# Patient Record
Sex: Male | Born: 1990
Health system: Southern US, Community
[De-identification: ages and names within clinical notes are randomized; demographics above are authoritative.]

## PROBLEM LIST (undated history)

## (undated) DIAGNOSIS — S3609XA Other injury of spleen, initial encounter: Secondary | ICD-10-CM

## (undated) DIAGNOSIS — C801 Malignant (primary) neoplasm, unspecified: Secondary | ICD-10-CM

## (undated) HISTORY — DX: Other injury of spleen, initial encounter: S36.09XA

## (undated) HISTORY — PX: KNEE SURGERY: SHX244

## (undated) HISTORY — DX: Malignant (primary) neoplasm, unspecified: C80.1

---
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
## 2010-06-01 NOTE — H&P (Signed)
[name], [name] NO.:  [id number]   MEDICAL RECORD NO.:  [id number]          PATIENT TYPE:  EMS   LOCATION:  ED                           FACILITY:  [facility]   PHYSICIAN:  Thornton Park. Randall Deutscher, MD  DATE OF BIRTH:  05/23/1990   DATE OF ADMISSION:  10/16/2007  DATE OF DISCHARGE:                              HISTORY & PHYSICAL   CHIEF COMPLAINT:  Abdominal pain after falling from a horse with splenic  laceration.   HISTORY OF PRESENT ILLNESS:  Randall Ward is an 20 year old white male  who fell from a horse landing on his left side yesterday afternoon,  October 15, 2007 at about 4 p.m.  There was no loss of consciousness.  It hurt when landed, but he had some increasing abdominal pain as the  night wore on and his father brought him to the [facility] Emergency  Room where he was seen and evaluated by me following evaluation by the  ER doctors.  The ER evaluation included a CT scan of his abdomen and  pelvis which showed a grade 4 splenic LAC and a preexistent mildly  enlarged splenomegalic organ.  There was some blood in his pelvis.  There did not appear to active extravasation on the CT when evaluated by  me.   The patient is comfortable, alert, oriented and in no distress and has  been hemodynamically stable.  After evaluation by me and then discussion  with Dr.  Megan Mans, we will transfer the patient from [facility] to  [facility] and I will admit him to the peds floor for observation and  management of his splenic laceration.   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 Ramiro Harvest who he  sees on the season on annual basis.   FAMILY HISTORY:  Noncontributory.   SOCIAL HISTORY:  He is an 20 year old boy, otherwise nothing.   The patient has no known drug allergies.   He takes no medications.   REVIEW OF SYSTEMS:  He denies any right lower quadrant abdominal pain,  GI  problems referable to his appendix.   PHYSICAL EXAMINATION:  He is afebrile, pulse 80, respirations 16, blood  pressure 133/51.  GENERAL:  Well developed, mildly overweight white male in no acute  distress, but he has received some pain meds prior to my evaluation.  HEAD:  Normocephalic.  EYES:  Sclerae nonicteric.  Pupils equal, round and reactive to light.  NOSE AND THROAT EXAM:  Unremarkable.  NECK:  Supple.  CHEST:  Clear to auscultation.  HEART: Sinus rhythm without murmurs.  ABDOMEN:  Mildly obese, but no exquisite tenderness.  No rebound or  guarding.  GENITALIA:  Normal.  EXTREMITIES EXAM:  Full range of motion with no complaints of  discomfort.  NEURO:  Alert, oriented x3.  Motor and sensory function grossly intact.  SKIN:  No abrasions noted.  PSYCH:  The young man seems appropriate in affect and cognition in my  discussion of this injury and its management with him and  his father.   LABORATORY DATA:  Hemoglobin 15.6, white count 15,000.  CMET was a  normal, except for mildly elevated glucose, total bilirubin 1.4 with  indirect of 1.2 perhaps suggesting an underlying Gilbert phenomenon.  CT  scan shows splenomegaly with grade 4 laceration and rupture with some  hemoperitoneum as evidenced by blood down in his pelvis.  There is also  incidental finding of an appendicolith.   IMPRESSION:  A traumatic splenic rupture, hemodynamically stable.   PLAN:  Admit to [facility] Trauma Service for observation.  We will transfer  the care length from [facility] to [facility].      Thornton Park Randall Deutscher, MD  Electronically Signed     MBM/MEDQ  D:  10/16/2007  T:  10/16/2007  Job:  [id number]   cc:   Ramiro Harvest, Dr.  [facility] [place], [place]

## 2010-06-01 NOTE — Discharge Summary (Signed)
Randall Ward, POUND NO.:  [id number]   MEDICAL RECORD NO.:  [id number]          PATIENT TYPE:  INP   LOCATION:  5154                         FACILITY:  [facility]   PHYSICIAN:  Cherylynn Ridges, M.D.    DATE OF BIRTH:  21-Sep-1990   DATE OF ADMISSION:  10/16/2007  DATE OF DISCHARGE:  10/20/2007                               DISCHARGE SUMMARY   ADMITTING TRAUMA SURGEON:  Molli Hazard B. Daphine Deutscher, MD   DISCHARGE DIAGNOSES:  1. Fall from a horse.  2. Grade 4 spleen laceration.  3. Mild acute blood loss anemia.  4. Mild obesity.  5. Remote history of acute lymphocytic leukemia as an infant.   HISTORY ON ADMISSION:  This is a very pleasant 20 year old male who fell  from a horse on the afternoon of October 15, 2007, landing directly  onto his left side.  He had no loss of consciousness.  He developed  progressive complaints of abdominal pain and was taken to the emergency  room at [facility].  He had some generalized tenderness over the left  upper quadrant, but was otherwise without complaints.  CT scan of his  abdomen showed splenomegaly with a grade 4 rupture and hemoperitoneum.  The patient was transferred to [facility] Trauma for further care.   The patient was admitted and placed in step-down unit initially for  close observation.  His initial hemoglobin dropped to a low of 11.4, but  this may have been partially hemodilutional.  He initially had an ileus,  but this quickly resolved and he was started on clear liquids and his  diet was advanced to regular as tolerated.  He was mobilized initially  out of bed to chair and then allowed to ambulate, on hospital day #5.  However, he developed nausea and vomiting on the anticipated morning of  discharge and therefore it was recommended that he continued to be  observed overnight.  He continued to do well overnight without any  further nausea or vomiting.  His hemoglobin the following morning was  12.5, hematocrit was 36.5,  white blood cell count was 10,700, and  platelets were 286,000.  He was tolerating p.o.'s and having bowel  movements and passing flatus, and it was felt he could be discharged  home with the supervision of his parents.   MEDICATIONS AT THE TIME OF DISCHARGE:  1. Percocet 5/325 one to two p.o. q.4 h. p.r.n. pain, #60 no refill.  2. Multivitamin with iron one daily with a meal.  3. Colace 100 mg one capsule b.i.d.  4. MiraLax 17 g p.r.n. constipation.   He is cautioned against no vigorous activity.  He was also cautioned to  avoid medications with natural anti-inflammatory components or aspirin  until cleared by his physician.  He cannot return to school at this  time, and we will reevaluate him on October 25, 2007.  It is likely that  we will plan on rescanning him at some point to reevaluate for return to  more vigorous activity and this was all discussed with the patient's  mother.  Shawn Rayburn, P.A.      Cherylynn Ridges, M.D.  Electronically Signed    SR/MEDQ  D:  10/20/2007  T:  10/20/2007  Job:  [id number]   cc:   [facility]

## 2010-08-30 ENCOUNTER — Ambulatory Visit: Payer: Self-pay | Admitting: Family Medicine

## 2010-10-18 LAB — CBC
HCT: 34.1 — ABNORMAL LOW
HCT: 36.1 — ABNORMAL LOW
HCT: 36.2 — ABNORMAL LOW
HCT: 37.1 — ABNORMAL LOW
HCT: 45
Hemoglobin: 11.8 — ABNORMAL LOW
Hemoglobin: 12.2 — ABNORMAL LOW
Hemoglobin: 12.4 — ABNORMAL LOW
Hemoglobin: 13
Hemoglobin: 15.6
MCHC: 33.9
MCHC: 34.2
MCHC: 34.6
MCHC: 34.7
MCHC: 35.1
MCV: 90.1
MCV: 90.8
MCV: 91.6
MCV: 92.4
MCV: 92.6
Platelets: 179
Platelets: 195
Platelets: 211
Platelets: 219
Platelets: 234
RBC: 3.68 — ABNORMAL LOW
RBC: 3.91 — ABNORMAL LOW
RBC: 3.96 — ABNORMAL LOW
RBC: 4.12 — ABNORMAL LOW
RBC: 4.96
RDW: 12.5
RDW: 13.1
RDW: 13.4
RDW: 13.6
RDW: 13.9
WBC: 10.2
WBC: 10.3
WBC: 11.8 — ABNORMAL HIGH
WBC: 15 — ABNORMAL HIGH
WBC: 17.3 — ABNORMAL HIGH

## 2010-10-18 LAB — URINALYSIS, ROUTINE W REFLEX MICROSCOPIC
Glucose, UA: NEGATIVE
Hgb urine dipstick: NEGATIVE
Ketones, ur: NEGATIVE
Leukocytes, UA: NEGATIVE
Nitrite: NEGATIVE
Protein, ur: 30 — AB
Specific Gravity, Urine: >1.046 — ABNORMAL HIGH
Urobilinogen, UA: 1
pH: 6

## 2010-10-18 LAB — POCT I-STAT, CHEM 8
BUN: 11
Calcium, Ion: 1.14
Chloride: 105
Creatinine, Ser: 1.2
Glucose, Bld: 121 — ABNORMAL HIGH
HCT: 47
Hemoglobin: 16
Potassium: 3.5
Sodium: 142
TCO2: 30

## 2010-10-18 LAB — URINE MICROSCOPIC-ADD ON

## 2010-10-18 LAB — HEPATIC FUNCTION PANEL
ALT: 23
AST: 31
Albumin: 4.2
Alkaline Phosphatase: 91
Bilirubin, Direct: 0.2
Indirect Bilirubin: 1.2 — ABNORMAL HIGH
Total Bilirubin: 1.4 — ABNORMAL HIGH
Total Protein: 7

## 2010-10-18 LAB — DIFFERENTIAL
Basophils Absolute: 0
Basophils Relative: 0
Eosinophils Absolute: 0
Eosinophils Relative: 0
Lymphocytes Relative: 5 — ABNORMAL LOW
Lymphs Abs: 0.8
Monocytes Absolute: 1.1 — ABNORMAL HIGH
Monocytes Relative: 7
Neutro Abs: 13.1 — ABNORMAL HIGH
Neutrophils Relative %: 87 — ABNORMAL HIGH

## 2010-10-18 LAB — PROTIME-INR
INR: 1.2
Prothrombin Time: 15.3 — ABNORMAL HIGH

## 2010-10-18 LAB — ABO/RH: ABO/RH(D): A POS

## 2010-10-18 LAB — TYPE AND SCREEN
ABO/RH(D): A POS
Antibody Screen: NEGATIVE

## 2010-10-19 LAB — BASIC METABOLIC PANEL
BUN: 6
CO2: 29
Calcium: 8.9
Chloride: 100
Creatinine, Ser: 0.83
GFR calc Af Amer: >60
GFR calc non Af Amer: >60
Glucose, Bld: 102 — ABNORMAL HIGH
Potassium: 3.9
Sodium: 136

## 2010-10-19 LAB — CBC
HCT: 33.2 — ABNORMAL LOW
HCT: 35.3 — ABNORMAL LOW
HCT: 36.5 — ABNORMAL LOW
HCT: 37.9 — ABNORMAL LOW
Hemoglobin: 11.4 — ABNORMAL LOW
Hemoglobin: 12.3 — ABNORMAL LOW
Hemoglobin: 12.5 — ABNORMAL LOW
Hemoglobin: 12.9 — ABNORMAL LOW
MCHC: 34.1
MCHC: 34.1
MCHC: 34.3
MCHC: 34.7
MCV: 91.6
MCV: 92.3
MCV: 92.8
MCV: 93.4
Platelets: 203
Platelets: 235
Platelets: 263
Platelets: 286
RBC: 3.57 — ABNORMAL LOW
RBC: 3.85 — ABNORMAL LOW
RBC: 3.91 — ABNORMAL LOW
RBC: 4.11 — ABNORMAL LOW
RDW: 13.1
RDW: 13.2
RDW: 13.3
RDW: 13.3
WBC: 10.7 — ABNORMAL HIGH
WBC: 11 — ABNORMAL HIGH
WBC: 11.2 — ABNORMAL HIGH
WBC: 11.7 — ABNORMAL HIGH

## 2010-12-23 ENCOUNTER — Ambulatory Visit (INDEPENDENT_AMBULATORY_CARE_PROVIDER_SITE_OTHER): Payer: BC Managed Care – PPO | Admitting: Family Medicine

## 2010-12-23 ENCOUNTER — Encounter: Payer: Self-pay | Admitting: Family Medicine

## 2010-12-23 VITALS — BP 126/68 | HR 63 | Temp 98.5°F | Ht 70.0 in | Wt 170.2 lb

## 2010-12-23 DIAGNOSIS — Z23 Encounter for immunization: Secondary | ICD-10-CM

## 2010-12-23 DIAGNOSIS — Z859 Personal history of malignant neoplasm, unspecified: Secondary | ICD-10-CM

## 2010-12-23 NOTE — Patient Instructions (Signed)
Take care, keep exercising and let me know if you have other concerns.  Glad to see you today.

## 2010-12-24 ENCOUNTER — Encounter: Payer: Self-pay | Admitting: Family Medicine

## 2010-12-24 DIAGNOSIS — Z859 Personal history of malignant neoplasm, unspecified: Secondary | ICD-10-CM | POA: Insufficient documentation

## 2010-12-24 DIAGNOSIS — Z23 Encounter for immunization: Secondary | ICD-10-CM | POA: Insufficient documentation

## 2010-12-24 NOTE — Progress Notes (Signed)
New to est care.   Needs flu shot.  D/w pt.  H/o ALL at 18 months, in remission, followed by [facility].  No bruising, bleeding, 'B' sx.  Requesting records.    H/o knee surgery, requesting records.    PMH and SH reviewed  ROS: See HPI, otherwise noncontributory.  Meds, vitals, and allergies reviewed.   GEN: nad, alert and oriented HEENT: mucous membranes moist NECK: supple w/o LA CV: rrr PULM: ctab, no inc wob ABD: soft, +bs EXT: no edema SKIN: no acute rash No axillary LA

## 2010-12-24 NOTE — Assessment & Plan Note (Signed)
Requesting records.  Benign exam and recent history.

## 2010-12-24 NOTE — Assessment & Plan Note (Signed)
Done today, d/w pt.

## 2011-01-20 ENCOUNTER — Encounter: Payer: Self-pay | Admitting: Family Medicine

## 2011-01-24 ENCOUNTER — Encounter: Payer: Self-pay | Admitting: Family Medicine

## 2011-02-25 ENCOUNTER — Encounter: Payer: Self-pay | Admitting: Family Medicine

## 2011-02-25 ENCOUNTER — Ambulatory Visit (INDEPENDENT_AMBULATORY_CARE_PROVIDER_SITE_OTHER): Payer: BC Managed Care – PPO | Admitting: Family Medicine

## 2011-02-25 VITALS — BP 110/56 | HR 78 | Temp 98.6°F | Ht 70.0 in | Wt 160.0 lb

## 2011-02-25 DIAGNOSIS — Z0000 Encounter for general adult medical examination without abnormal findings: Secondary | ICD-10-CM

## 2011-02-25 DIAGNOSIS — Z029 Encounter for administrative examinations, unspecified: Secondary | ICD-10-CM

## 2011-02-25 LAB — POCT URINALYSIS DIPSTICK
Bilirubin, UA: NEGATIVE
Blood, UA: NEGATIVE
Glucose, UA: NEGATIVE
Ketones, UA: NEGATIVE
Leukocytes, UA: NEGATIVE
Nitrite, UA: NEGATIVE
Protein, UA: NEGATIVE
Spec Grav, UA: 1.01
Urobilinogen, UA: NEGATIVE
pH, UA: 6

## 2011-02-25 NOTE — Patient Instructions (Signed)
Glad to see you.  Let me know if you have other concerns.

## 2011-02-27 ENCOUNTER — Encounter: Payer: Self-pay | Admitting: Family Medicine

## 2011-02-27 DIAGNOSIS — Z029 Encounter for administrative examinations, unspecified: Secondary | ICD-10-CM | POA: Insufficient documentation

## 2011-02-27 NOTE — Progress Notes (Signed)
DOT physical.  See scanned forms. No complaints of any variety.   PMH and SH reviewed  Meds, vitals, and allergies reviewed.   ROS: See HPI.  Otherwise negative.    GEN: nad, alert and oriented HEENT: mucous membranes moist NECK: supple w/o LA CV: rrr. PULM: ctab, no inc wob ABD: soft, +bs EXT: no edema SKIN: no acute rash

## 2011-02-27 NOTE — Assessment & Plan Note (Addendum)
Okay for DOT application per history and exam.  See scanned forms. Should be okay for 2 year period. >25 min spent with face to face with patient, >50% counseling and/or coordinating care.

## 2011-11-17 ENCOUNTER — Telehealth: Payer: Self-pay

## 2011-11-17 NOTE — Telephone Encounter (Signed)
Pts father calls;Pt in [place] working today until late this evening; pt will be at home 11/18/11; last night started with severe S/T, drainage at back of throat, aches all over. Not sure if fever; pt did not tell father about head or chest congestion or cough. Main complaint was S/T and aching all over.[place].Should pt be seen or can med be called in for S/T.Please advise.

## 2011-11-17 NOTE — Telephone Encounter (Signed)
Patient's Dad advised.  Appt scheduled 11/18/11

## 2011-11-17 NOTE — Telephone Encounter (Signed)
Have him get back in town tomorrow.  If the symptoms continue, please add him to the schedule tomorrow.  He needs to be careful to avoid sedation cough medicine in meantime given his job.  Tylenol and salt water gargles are reasonable in meantime.  Thanks.

## 2011-11-18 ENCOUNTER — Ambulatory Visit: Payer: BC Managed Care – PPO | Admitting: Family Medicine

## 2011-11-24 ENCOUNTER — Ambulatory Visit (INDEPENDENT_AMBULATORY_CARE_PROVIDER_SITE_OTHER): Payer: BC Managed Care – PPO | Admitting: Family Medicine

## 2011-11-24 ENCOUNTER — Encounter: Payer: Self-pay | Admitting: Family Medicine

## 2011-11-24 VITALS — BP 122/60 | HR 86 | Temp 98.5°F | Wt 167.0 lb

## 2011-11-24 DIAGNOSIS — J069 Acute upper respiratory infection, unspecified: Secondary | ICD-10-CM

## 2011-11-24 MED ORDER — AMOXICILLIN 875 MG PO TABS: 875.0000 mg | ORAL_TABLET | Freq: Two times a day (BID) | ORAL | Status: AC

## 2011-11-24 NOTE — Assessment & Plan Note (Addendum)
Given the ST with exudates, I would treat.  Start amoxil and f/u prn.  Out of work in meantime.  Nontoxic.  He agrees.  See instructions.  He'll get a flu shot later.

## 2011-11-24 NOTE — Patient Instructions (Addendum)
Start the amoxil today.  Drink plenty of fluids, take ibuprofen as needed with food, and gargle with warm salt water for your throat.  This should gradually improve.  Take care.  Let us know if you have other concerns.

## 2011-11-24 NOTE — Progress Notes (Signed)
duration of symptoms: >1 week Started with ST and diffuse myalgias Took aleve and rested Travelled to a wedding, was improved.  Yesterday ST started again.  Now with more ear pain, coughing, bloody nose.   Possible fever last week.  No rash.  No sputum.   No facial pain.   The myalgias are some better today.   ST continues, has gotten worse.    ROS: See HPI.  Otherwise negative.    Meds, vitals, and allergies reviewed.   GEN: nad, alert and oriented HEENT: mucous membranes moist, TM w/o erythema, nasal epithelium injected- stuffy on R side with purulent discharge- bloody discharge on L side (not actively bleeding), OP with cobblestoning and tonsil with exudates NECK: supple w/o LA CV: rrr. PULM: ctab, no inc wob ABD: soft, +bs EXT: no edema

## 2012-01-27 LAB — PULMONARY FUNCTION TEST

## 2012-02-02 ENCOUNTER — Encounter: Payer: Self-pay | Admitting: Family Medicine

## 2012-02-02 ENCOUNTER — Ambulatory Visit (INDEPENDENT_AMBULATORY_CARE_PROVIDER_SITE_OTHER): Payer: BC Managed Care – PPO | Admitting: Family Medicine

## 2012-02-02 VITALS — BP 112/70 | HR 67 | Temp 98.3°F | Wt 177.0 lb

## 2012-02-02 DIAGNOSIS — R7989 Other specified abnormal findings of blood chemistry: Secondary | ICD-10-CM

## 2012-02-02 DIAGNOSIS — Z23 Encounter for immunization: Secondary | ICD-10-CM

## 2012-02-02 LAB — HEPATIC FUNCTION PANEL
ALT: 38 U/L (ref 0–53)
AST: 28 U/L (ref 0–37)
Albumin: 4.3 g/dL (ref 3.5–5.2)
Alkaline Phosphatase: 45 U/L (ref 39–117)
Bilirubin, Direct: 0 mg/dL (ref 0.0–0.3)
Total Bilirubin: 1.2 mg/dL (ref 0.3–1.2)
Total Protein: 7.3 g/dL (ref 6.0–8.3)

## 2012-02-02 NOTE — Patient Instructions (Addendum)
Go to the lab on the way out.  We'll contact you with your lab report.    Get the booster shot today, recheck vaccine lab in 1 month.  Take care.

## 2012-02-03 DIAGNOSIS — R7989 Other specified abnormal findings of blood chemistry: Secondary | ICD-10-CM | POA: Insufficient documentation

## 2012-02-03 NOTE — Progress Notes (Signed)
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  Also with low HBV titer.   No h/o jaundice other than after birth, resolved.  No abd pain, fever, vomiting, diarrhea, abd surgery.  Rare etoh.    Labs reviewed.   Meds, vitals, and allergies reviewed.   ROS: See HPI.  Otherwise, noncontributory.  GEN: nad, alert and oriented HEENT: mucous membranes moist NECK: supple w/o LA CV: rrr.  no murmur PULM: ctab, no inc wob ABD: soft, +bs EXT: no edema SKIN: no acute rash

## 2012-02-03 NOTE — Assessment & Plan Note (Signed)
Normal exam, resolve on recheck.  Likely false positive.  No f/u needed for this.  HBV booster done today, recheck titer in about 1 month.  He agrees.

## 2012-09-20 ENCOUNTER — Ambulatory Visit (INDEPENDENT_AMBULATORY_CARE_PROVIDER_SITE_OTHER): Payer: BC Managed Care – PPO | Admitting: Family Medicine

## 2012-09-20 ENCOUNTER — Encounter: Payer: Self-pay | Admitting: Family Medicine

## 2012-09-20 VITALS — BP 110/76 | HR 63 | Temp 97.9°F | Wt 183.5 lb

## 2012-09-20 DIAGNOSIS — L989 Disorder of the skin and subcutaneous tissue, unspecified: Secondary | ICD-10-CM

## 2012-09-20 NOTE — Progress Notes (Signed)
Spot on R arm.  Noted 2 days ago.  Feels tough, under the skin.  Not painful, not itchy.  Single spot noted.   Also with 2 nearby insect (not tick) bites.   Feels well o/w. No FCNAVD, weight loss,sweats.   Meds, vitals, and allergies reviewed.   ROS: See HPI.  Otherwise, noncontributory.  nad ncat Mmm rrr ctab abd soft No LA in neck, along clavicles, axillas.   2 small resolving pink lesions, <<1cm, appear consistent with insect bite.  <1cm lesion in the skin medial to R elbow, proximally.  Feels like a small nontender LN

## 2012-09-20 NOTE — Patient Instructions (Addendum)
Keep an eye on the spot.  Update me in about 7-10 days.   Notify me sooner if red, tender, enlarging, or if you feel worse.  I think this is either a lymph node or a benign (not cancer) tumor (like a lipoma).

## 2012-09-21 DIAGNOSIS — L989 Disorder of the skin and subcutaneous tissue, unspecified: Secondary | ICD-10-CM | POA: Insufficient documentation

## 2012-09-21 NOTE — Assessment & Plan Note (Signed)
LN vs cyst vs lipoma.  Would observe for now can call back prn. He agrees. No other sx suggestive of ominous process. This could have been related to the 2 likely insect bites. Pt agrees with plan.

## 2012-12-05 ENCOUNTER — Encounter: Payer: Self-pay | Admitting: Family Medicine

## 2012-12-05 ENCOUNTER — Ambulatory Visit (INDEPENDENT_AMBULATORY_CARE_PROVIDER_SITE_OTHER): Payer: BC Managed Care – PPO | Admitting: Family Medicine

## 2012-12-05 VITALS — BP 118/78 | HR 88 | Temp 99.2°F | Ht 70.0 in | Wt 167.8 lb

## 2012-12-05 DIAGNOSIS — J029 Acute pharyngitis, unspecified: Secondary | ICD-10-CM

## 2012-12-05 DIAGNOSIS — J069 Acute upper respiratory infection, unspecified: Secondary | ICD-10-CM

## 2012-12-05 NOTE — Patient Instructions (Signed)
Drink plenty of fluids, take tylenol as needed, and gargle with warm salt water for your throat.  This should gradually improve.  Take care.  Let us know if you have other concerns.    

## 2012-12-05 NOTE — Progress Notes (Signed)
Pre-visit discussion using our clinic review tool. No additional management support is needed unless otherwise documented below in the visit note.  Started yesterday with ST.  Chills.  Coughing and sneezing.  Yellow rhinorrhea.  Less ST today.  Works outside. Feels weak.  Dull aches, diffusely.  No rash. No vomiting, no diarrhea.    Meds, vitals, and allergies reviewed.   ROS: See HPI.  Otherwise, noncontributory.  GEN: nad, alert and oriented HEENT: mucous membranes moist, tm w/o erythema, nasal exam w/o erythema, clear discharge noted,  OP with cobblestoning, sinuses not ttp NECK: supple w/o LA CV: rrr.   PULM: ctab, no inc wob EXT: no edema SKIN: no acute rash

## 2012-12-06 NOTE — Assessment & Plan Note (Signed)
Likely viral, nontoxic, supportive care. RST neg. D/w pt.

## 2013-06-07 ENCOUNTER — Ambulatory Visit (INDEPENDENT_AMBULATORY_CARE_PROVIDER_SITE_OTHER): Payer: BC Managed Care – PPO | Admitting: Family Medicine

## 2013-06-07 ENCOUNTER — Encounter: Payer: Self-pay | Admitting: Family Medicine

## 2013-06-07 VITALS — BP 122/80 | HR 70 | Temp 98.3°F | Wt 177.8 lb

## 2013-06-07 DIAGNOSIS — J029 Acute pharyngitis, unspecified: Secondary | ICD-10-CM

## 2013-06-07 DIAGNOSIS — J069 Acute upper respiratory infection, unspecified: Secondary | ICD-10-CM

## 2013-06-07 LAB — POCT RAPID STREP A (OFFICE): Rapid Strep A Screen: NEGATIVE

## 2013-06-07 MED ORDER — LIDOCAINE VISCOUS 2 % MT SOLN: 10.0000 mL | OROMUCOSAL | Status: DC | PRN

## 2013-06-07 NOTE — Assessment & Plan Note (Signed)
Likely viral, nontoxic, supportive care.  See instructions.  F/u prn.

## 2013-06-07 NOTE — Progress Notes (Signed)
Pre visit review using our clinic review tool, if applicable. No additional management support is needed unless otherwise documented below in the visit note.  duration of symptoms: a few days Post nasal gtt: yes Rhinorrhea: no congestion:no ear pain:no sore throat:yes cough:yes Myalgias: yes, possibly from working out vs illness Fevers: no No vomiting, no diarrhea.   ROS: See HPI.  Otherwise negative.    Meds, vitals, and allergies reviewed.   GEN: nad, alert and oriented HEENT: mucous membranes moist, TM w/o erythema, nasal epithelium injected, OP with cobblestoning NECK: supple w/o LA CV: rrr. PULM: ctab, no inc wob ABD: soft, +bs EXT: no edema  RST neg.

## 2013-06-07 NOTE — Patient Instructions (Signed)
Drink plenty of fluids, take tylenol as needed, and gargle with warm salt water for your throat.  This should gradually improve.  Take care.  Let us know if you have other concerns.   Use lidocaine for your throat if needed.

## 2013-10-03 ENCOUNTER — Telehealth: Payer: Self-pay | Admitting: *Deleted

## 2013-10-03 DIAGNOSIS — R7989 Other specified abnormal findings of blood chemistry: Secondary | ICD-10-CM

## 2013-10-03 NOTE — Telephone Encounter (Signed)
See notes received from [facility] Dr. Johney Frame. Left message for patient to call back to discuss per Dr. Josefine Class notes.

## 2013-10-09 ENCOUNTER — Other Ambulatory Visit (INDEPENDENT_AMBULATORY_CARE_PROVIDER_SITE_OTHER): Payer: BC Managed Care – PPO

## 2013-10-09 DIAGNOSIS — R799 Abnormal finding of blood chemistry, unspecified: Secondary | ICD-10-CM

## 2013-10-09 DIAGNOSIS — R7989 Other specified abnormal findings of blood chemistry: Secondary | ICD-10-CM

## 2013-10-09 NOTE — Telephone Encounter (Signed)
Ordered. Thanks

## 2013-10-09 NOTE — Telephone Encounter (Signed)
Spoke to patient and was advised that he has seen the renal doctor and has had his Creatinine checked twice. Patient requested to have it checked this time by Dr. Damita Dunnings. Patient is on the schedule this afternoon to have lab work done. Please put order in.

## 2013-10-10 LAB — BASIC METABOLIC PANEL
BUN: 15 mg/dL (ref 6–23)
CO2: 30 mEq/L (ref 19–32)
CREATININE: 1.2 mg/dL (ref 0.4–1.5)
Calcium: 9.2 mg/dL (ref 8.4–10.5)
Chloride: 105 mEq/L (ref 96–112)
GFR: 82.11 mL/min (ref >60.00)
Glucose, Bld: 71 mg/dL (ref 70–99)
POTASSIUM: 4.2 meq/L (ref 3.5–5.1)
Sodium: 141 mEq/L (ref 135–145)

## 2013-10-11 ENCOUNTER — Telehealth: Payer: Self-pay | Admitting: Family Medicine

## 2013-10-11 NOTE — Telephone Encounter (Signed)
Opened in error

## 2014-05-28 ENCOUNTER — Ambulatory Visit (INDEPENDENT_AMBULATORY_CARE_PROVIDER_SITE_OTHER): Payer: BLUE CROSS/BLUE SHIELD | Admitting: Primary Care

## 2014-05-28 ENCOUNTER — Encounter: Payer: Self-pay | Admitting: Primary Care

## 2014-05-28 VITALS — BP 122/76 | HR 90 | Temp 99.5°F | Ht 70.0 in | Wt 199.8 lb

## 2014-05-28 DIAGNOSIS — J111 Influenza due to unidentified influenza virus with other respiratory manifestations: Secondary | ICD-10-CM | POA: Diagnosis not present

## 2014-05-28 LAB — POCT INFLUENZA A/B
INFLUENZA A, POC: POSITIVE
INFLUENZA B, POC: POSITIVE

## 2014-05-28 MED ORDER — OSELTAMIVIR PHOSPHATE 75 MG PO CAPS: 75.0000 mg | ORAL_CAPSULE | Freq: Two times a day (BID) | ORAL | Status: DC

## 2014-05-28 NOTE — Progress Notes (Signed)
Pre visit review using our clinic review tool, if applicable. No additional management support is needed unless otherwise documented below in the visit note. 

## 2014-05-28 NOTE — Progress Notes (Signed)
Subjective:    Patient ID: Randall Ward, male    DOB: Feb 11, 1990, 24 y.o.   MRN: [id number]  HPI  Randall Ward is a 24 year old male who presents today with a chief complaint of sudden onset of body aches and headache that started yesterday morning. He also reports cough with some congestion, chills, and fevers. He did not receive the flu vaccine last season. He's tried taking tylenol and alka seltzer cold without relief. Denies chest pain, abdominal pain, nausea, vomiting.   Review of Systems  Constitutional: Positive for fever, chills and fatigue.  HENT: Positive for congestion, rhinorrhea and sinus pressure. Negative for ear pain, sneezing and sore throat.   Eyes: Negative for itching.  Respiratory: Positive for cough. Negative for shortness of breath.   Cardiovascular: Negative for chest pain.  Gastrointestinal: Negative for nausea and vomiting.  Neurological: Positive for headaches.       Past Medical History  Diagnosis Date  . Cancer     ALL at 18 months, per [facility]  . Ruptured spleen     no surgery    History   Social History  . Marital Status: Single    Spouse Name: N/A  . Number of Children: N/A  . Years of Education: N/A   Occupational History  . Not on file.   Social History Main Topics  . Smoking status: Never Smoker   . Smokeless tobacco: Never Used  . Alcohol Use: No  . Drug Use: No  . Sexual Activity: Not on file   Other Topics Concern  . Not on file   Social History Narrative   [employer] grad   Working at [employer]    Past Surgical History  Procedure Laterality Date  . Knee surgery      left, 2012    Family History  Problem Relation Age of Onset  . Diabetes Father   . Arthritis Father   . Heart disease Father   . Hypertension Father   . Prostate cancer Neg Hx   . Colon cancer Neg Hx     No Known Allergies  Current Outpatient Prescriptions on File Prior to Visit  Medication Sig Dispense Refill  . ibuprofen (ADVIL,MOTRIN)  200 MG tablet Take 200 mg by mouth every 6 (six) hours as needed.     No current facility-administered medications on file prior to visit.    BP 122/76 mmHg  Pulse 90  Temp(Src) 99.5 F (37.5 C) (Oral)  Ht 5\' 10"  (1.778 m)  Wt 199 lb 12.8 oz (90.629 kg)  BMI 28.67 kg/m2  SpO2 97%    Objective:   Physical Exam  Constitutional: He is oriented to person, place, and time. He appears ill.  HENT:  Right Ear: Tympanic membrane and ear canal normal.  Left Ear: Tympanic membrane and ear canal normal.  Mouth/Throat: Posterior oropharyngeal erythema present.  Eyes: Conjunctivae are normal. Pupils are equal, round, and reactive to light.  Neck: Neck supple.  Cardiovascular: Normal rate and regular rhythm.   Pulmonary/Chest: Effort normal and breath sounds normal.  Lymphadenopathy:    He has no cervical adenopathy.  Neurological: He is alert and oriented to person, place, and time.  Skin: Skin is warm and dry.          Assessment & Plan:  Rapid Influenza:  Positive. Treatment with Tamiflu BID for 5 days. Declines cough medication. Push fluids, rest. Work note provided. Education on proper hand hygiene and to wear mask if  out in public.

## 2014-05-28 NOTE — Patient Instructions (Signed)
Your test was positive for influenza. Start Tamiflu. Take 1 capsule by mouth twice daily for 5 days. Push fluids (water), and get some rest. You may take Robitussin OTC for cough as needed.  Influenza Influenza ("the flu") is a viral infection of the respiratory tract. It occurs more often in winter months because people spend more time in close contact with one another. Influenza can make you feel very sick. Influenza easily spreads from person to person (contagious). CAUSES  Influenza is caused by a virus that infects the respiratory tract. You can catch the virus by breathing in droplets from an infected person's cough or sneeze. You can also catch the virus by touching something that was recently contaminated with the virus and then touching your mouth, nose, or eyes. RISKS AND COMPLICATIONS You may be at risk for a more severe case of influenza if you smoke cigarettes, have diabetes, have chronic heart disease (such as heart failure) or lung disease (such as asthma), or if you have a weakened immune system. Elderly people and pregnant women are also at risk for more serious infections. The most common problem of influenza is a lung infection (pneumonia). Sometimes, this problem can require emergency medical care and may be life threatening. SIGNS AND SYMPTOMS  Symptoms typically last 4 to 10 days and may include:  Fever.  Chills.  Headache, body aches, and muscle aches.  Sore throat.  Chest discomfort and cough.  Poor appetite.  Weakness or feeling tired.  Dizziness.  Nausea or vomiting. DIAGNOSIS  Diagnosis of influenza is often made based on your history and a physical exam. A nose or throat swab test can be done to confirm the diagnosis. TREATMENT  In mild cases, influenza goes away on its own. Treatment is directed at relieving symptoms. For more severe cases, your health care provider may prescribe antiviral medicines to shorten the sickness. Antibiotic medicines are not  effective because the infection is caused by a virus, not by bacteria. HOME CARE INSTRUCTIONS  Take medicines only as directed by your health care provider.  Use a cool mist humidifier to make breathing easier.  Get plenty of rest until your temperature returns to normal. This usually takes 3 to 4 days.  Drink enough fluid to keep your urine clear or pale yellow.  Cover yourmouth and nosewhen coughing or sneezing,and wash your handswellto prevent thevirusfrom spreading.  Stay homefromwork orschool untilthe fever is gonefor at least 67full day. PREVENTION  An annual influenza vaccination (flu shot) is the best way to avoid getting influenza. An annual flu shot is now routinely recommended for all adults in the [place] IF:  You experiencechest pain, yourcough worsens,or you producemore mucus.  Youhave nausea,vomiting, ordiarrhea.  Your fever returns or gets worse. SEEK IMMEDIATE MEDICAL CARE IF:  You havetrouble breathing, you become short of breath,or your skin ornails becomebluish.  You have severe painor stiffnessin the neck.  You develop a sudden headache, or pain in the face or ear.  You have nausea or vomiting that you cannot control. MAKE SURE YOU:   Understand these instructions.  Will watch your condition.  Will get help right away if you are not doing well or get worse. Document Released: 01/01/2000 Document Revised: 05/20/2013 Document Reviewed: 04/04/2011 [facility] Patient Information 2015 [place], [place]. This information is not intended to replace advice given to you by your health care provider. Make sure you discuss any questions you have with your health care provider.

## 2014-05-29 NOTE — Addendum Note (Signed)
Addended by: Jacqualin Combes on: 05/29/2014 08:55 AM   Modules accepted: Orders

## 2014-07-29 ENCOUNTER — Encounter: Payer: Self-pay | Admitting: Family Medicine

## 2014-07-29 ENCOUNTER — Ambulatory Visit (INDEPENDENT_AMBULATORY_CARE_PROVIDER_SITE_OTHER): Payer: BLUE CROSS/BLUE SHIELD | Admitting: Family Medicine

## 2014-07-29 VITALS — BP 130/80 | HR 60 | Temp 98.5°F | Wt 199.5 lb

## 2014-07-29 DIAGNOSIS — M79602 Pain in left arm: Secondary | ICD-10-CM

## 2014-07-29 MED ORDER — CYCLOBENZAPRINE HCL 10 MG PO TABS: 5.0000 mg | ORAL_TABLET | Freq: Three times a day (TID) | ORAL | Status: DC | PRN

## 2014-07-29 MED ORDER — PREDNISONE 20 MG PO TABS: ORAL_TABLET | ORAL | Status: DC

## 2014-07-29 NOTE — Patient Instructions (Signed)
Prednisone with food.  Sedation caution on flexeril.  Out of work for now.  Use heat for now.

## 2014-07-29 NOTE — Progress Notes (Signed)
Pre visit review using our clinic review tool, if applicable. No additional management support is needed unless otherwise documented below in the visit note.  L neck and arm pain.  Started last night.  Was reaching to plug in a phone last night when it started.  Pain radiates down to the L elbow.  No R sided sx.  No leg sx.  No FCNAVD.  He has ROM but pain with ROM.  Pain with neck ROM. No other trauma, no cause noted.   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    Meds, vitals, and allergies reviewed.   ROS: See HPI.  Otherwise, noncontributory.  nad ncat Neck supple but pain with ROM L arm with normal sensation and ROM but L trap ttp rrr ctab

## 2014-07-31 DIAGNOSIS — M79602 Pain in left arm: Secondary | ICD-10-CM | POA: Insufficient documentation

## 2014-07-31 NOTE — Assessment & Plan Note (Signed)
Likely radicular source with muscle spasm.  Start pred with routine cautions, sedation caution on flexeril, f/u prn.  He agrees.  No weakness, no need to image.

## 2016-01-04 ENCOUNTER — Ambulatory Visit (INDEPENDENT_AMBULATORY_CARE_PROVIDER_SITE_OTHER): Payer: Managed Care, Other (non HMO) | Admitting: Family Medicine

## 2016-01-04 ENCOUNTER — Encounter: Payer: Self-pay | Admitting: Family Medicine

## 2016-01-04 VITALS — BP 104/58 | HR 68 | Temp 98.6°F | Wt 211.5 lb

## 2016-01-04 DIAGNOSIS — M545 Low back pain, unspecified: Secondary | ICD-10-CM | POA: Insufficient documentation

## 2016-01-04 DIAGNOSIS — R05 Cough: Secondary | ICD-10-CM

## 2016-01-04 DIAGNOSIS — Z23 Encounter for immunization: Secondary | ICD-10-CM | POA: Diagnosis not present

## 2016-01-04 DIAGNOSIS — R350 Frequency of micturition: Secondary | ICD-10-CM | POA: Insufficient documentation

## 2016-01-04 DIAGNOSIS — M549 Dorsalgia, unspecified: Secondary | ICD-10-CM | POA: Diagnosis not present

## 2016-01-04 DIAGNOSIS — R059 Cough, unspecified: Secondary | ICD-10-CM | POA: Insufficient documentation

## 2016-01-04 LAB — POC URINALSYSI DIPSTICK (AUTOMATED)
Bilirubin, UA: NEGATIVE
Blood, UA: NEGATIVE
GLUCOSE UA: NEGATIVE
Ketones, UA: NEGATIVE
Leukocytes, UA: NEGATIVE
Nitrite, UA: NEGATIVE
PROTEIN UA: NEGATIVE
Spec Grav, UA: 1.025
UROBILINOGEN UA: 0.2
pH, UA: 6.5

## 2016-01-04 MED ORDER — FLUTICASONE PROPIONATE 50 MCG/ACT NA SUSP: 2.0000 | Freq: Every day | NASAL | Status: DC

## 2016-01-04 MED ORDER — LORATADINE 10 MG PO TABS: 10.0000 mg | ORAL_TABLET | Freq: Every day | ORAL | Status: DC

## 2016-01-04 NOTE — Assessment & Plan Note (Signed)
Likely from caffeine.  U/a wnl.  See notes on labs.

## 2016-01-04 NOTE — Patient Instructions (Signed)
Randall Ward will call about your referral. Take care.  Glad to see you.  Add on flonase if claritin doesn't help.  Update me as needed.

## 2016-01-04 NOTE — Assessment & Plan Note (Signed)
Likely worse with smoke exposure (dec in cough when coworker changed to vaping) and possible nasal irritation contribution with post nasal gtt.  Lungs clear.  Add on claritin and then flonase if needed.  We didn't need to image today.  Okay for outpatient f/u. He'll update me if not better.

## 2016-01-04 NOTE — Assessment & Plan Note (Signed)
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  Reasonable to see chiropractor.  D/w pt.  Refer.

## 2016-01-04 NOTE — Progress Notes (Signed)
Cough.  Going on for a few months.  Feels like throat congestion, "like a tickle in my throat."  Slightly less worse recently.  Intermittent.  Rare sputum.  No FCNAVD.  Codriver at work vapes.    Back pain.  Asking about going to chiropractor.  Driving to [place], makes about 8 stops there, then drives back.  He usually makes 2 runs a week.  Lower midline back, popping and cracking.  No leg radiation.  No weakness.  Some tingling in the knees.  He doesn't sit on his wallet.  No trauma.  No B/B sx.    Noted some urinary frequency.  U/a pending.  D/w pt about caffeine intake with work, likely contributing.  No dysuria o/w.    Meds, vitals, and allergies reviewed.   ROS: Per HPI unless specifically indicated in ROS section   nad ncat Tm wnl Nasal irritation noted.  OP wnl MMM Neck supple, no LA rrr ctab Back not ttp in midline Skin w/o rash BLE w/o edema S/S wnl BLE

## 2016-01-04 NOTE — Progress Notes (Signed)
Pre visit review using our clinic review tool, if applicable. No additional management support is needed unless otherwise documented below in the visit note. 

## 2016-04-14 ENCOUNTER — Ambulatory Visit (INDEPENDENT_AMBULATORY_CARE_PROVIDER_SITE_OTHER): Payer: No Typology Code available for payment source | Admitting: Family Medicine

## 2016-04-14 ENCOUNTER — Encounter: Payer: Self-pay | Admitting: Family Medicine

## 2016-04-14 DIAGNOSIS — M542 Cervicalgia: Secondary | ICD-10-CM

## 2016-04-14 DIAGNOSIS — Z720 Tobacco use: Secondary | ICD-10-CM | POA: Diagnosis not present

## 2016-04-14 NOTE — Progress Notes (Signed)
D/w pt about cessation of tobacco.  D/w pt about having a replacement for oral tobacco.    Neck pain.  Worse in the last week.  Upper posterior neck pain.  Bilateral pain, posterior, can radiate up his scalp/head, not midline.  He has a new pillow may not explain all of the sx.    Back pain.  He didn't get to see chiropractor about his lower back prev.  No radicular pain.  No B/B sx.  No weakness.    No FCNAVD.  Not fatigued.    Working 1 day on, 2 days off with Research officer, trade union.    He has more pain with prolonged sitting, ie either driving or in meetings.    Meds, vitals, and allergies reviewed.   ROS: Per HPI unless specifically indicated in ROS section   nad ncat Neck supple no midline pain but B paraspinal muscles ttp w/o bruising or rash No LA rrr Ctab abd soft Ext w/o edema.  No midline back pain.

## 2016-04-14 NOTE — Patient Instructions (Signed)
Try 2-3 ibuprofen up to 2-3 times a day with food.   See about getting a number for a chiropractor and I can put in the referral if needed.  Gently stretch your neck.  Update me as needed.   Take care.  Glad to see you.

## 2016-04-14 NOTE — Progress Notes (Signed)
Pre visit review using our clinic review tool, if applicable. No additional management support is needed unless otherwise documented below in the visit note. 

## 2016-04-17 DIAGNOSIS — Z720 Tobacco use: Secondary | ICD-10-CM | POA: Insufficient documentation

## 2016-04-17 DIAGNOSIS — M542 Cervicalgia: Secondary | ICD-10-CM | POA: Insufficient documentation

## 2016-04-17 NOTE — Assessment & Plan Note (Signed)
Encouraged cessation, d/w pt about strategies.

## 2016-04-17 NOTE — Assessment & Plan Note (Signed)
Likely MSK source with muscle spasms, likely with occipital neuralgia contributing, d/w pt.  Okay to see chiropractor, no need for imaging at this point, d/w pt about stretching.  He agrees.    He will also check with seeing chiropractor about his back.    Okay for outpatient f/u.

## 2017-11-22 ENCOUNTER — Encounter: Payer: Self-pay | Admitting: Family Medicine

## 2017-11-22 ENCOUNTER — Ambulatory Visit: Payer: 59 | Admitting: Family Medicine

## 2017-11-22 ENCOUNTER — Ambulatory Visit (INDEPENDENT_AMBULATORY_CARE_PROVIDER_SITE_OTHER)
Admission: RE | Admit: 2017-11-22 | Discharge: 2017-11-22 | Disposition: A | Discharge status: Home / Self Care | Payer: 59 | Source: Ambulatory Visit | Attending: Family Medicine | Admitting: Family Medicine

## 2017-11-22 VITALS — BP 102/66 | HR 72 | Temp 98.4°F | Ht 70.0 in | Wt 211.0 lb

## 2017-11-22 DIAGNOSIS — M2550 Pain in unspecified joint: Secondary | ICD-10-CM | POA: Diagnosis not present

## 2017-11-22 DIAGNOSIS — M542 Cervicalgia: Secondary | ICD-10-CM | POA: Diagnosis not present

## 2017-11-22 DIAGNOSIS — Z23 Encounter for immunization: Secondary | ICD-10-CM

## 2017-11-22 DIAGNOSIS — M79641 Pain in right hand: Secondary | ICD-10-CM | POA: Diagnosis not present

## 2017-11-22 NOTE — Patient Instructions (Signed)
Go to the lab on the way out.  We'll contact you with your xray and lab report. Take care.  Glad to see you.  We may need to get you set up with an arthritis clinic.   In the meantime, use a tennis elbow strap for your elbow.  See if that helps.

## 2017-11-22 NOTE — Progress Notes (Signed)
He had h/o neck pain.  Still with some pain with popping and cracking on ROM and some occipital headaches noted.    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  He has had B lateral elbow pain and B hand pain.  Most of the hand pain is near the metacarpals bilaterally.  He doesn't have typical neuropathic sx.  His hand/fingers don't get puffy or red.   H/o leukemia treatment at young age.    Meds, vitals, and allergies reviewed.   ROS: Per HPI unless specifically indicated in ROS section   GEN: nad, alert and oriented HEENT: mucous membranes moist NECK: supple w/o LA, normal range of motion CV: rrr. PULM: ctab, no inc wob ABD: soft, +bs EXT: no edema SKIN: no acute rash Normal grip bilaterally.  Normal sensation.  No tendon deficit on testing the fingers.  He does not have any active synovitis across the IP joints or the MCPs.  Wrists are normal on inspection.  Tinel negative bilaterally at the wrist. Normal radial pulse, normal cap refill. Normal range of motion of the shoulders and elbows.  He does not have tenderness on testing for tennis elbow.  Olecranon nontender.  No rash.

## 2017-11-23 DIAGNOSIS — M2550 Pain in unspecified joint: Secondary | ICD-10-CM | POA: Insufficient documentation

## 2017-11-23 NOTE — Assessment & Plan Note (Addendum)
He has a history of leukemia that was treated at a young age.  Unclear if that in any way affects his current situation.  He does have atypical amounts of aches given his age.  We talked about options to work this up.  Reasonable to check plain films of his hand and neck today.  Check basic labs in the meantime.  He does not have active synovitis on exam today.  No atypical findings at this point.  It is noted that today is a "good day" and he has not been as active recently.  I want him to try using a tennis elbow strap to see if that helps his elbow pain.  He may not have tennis elbow but it would be worth trying to see if this makes a difference, when he has symptoms.

## 2017-11-25 LAB — ANTI-NUCLEAR AB-TITER (ANA TITER)

## 2017-11-25 LAB — ANA,IFA RA DIAG PNL W/RFLX TIT/PATN
ANA: POSITIVE — AB
Cyclic Citrullin Peptide Ab: <16 UNITS
Rhuematoid fact SerPl-aCnc: <14 IU/mL (ref <14)

## 2017-11-26 ENCOUNTER — Other Ambulatory Visit: Payer: Self-pay | Admitting: Family Medicine

## 2017-11-26 DIAGNOSIS — M2550 Pain in unspecified joint: Secondary | ICD-10-CM

## 2017-11-28 ENCOUNTER — Telehealth: Payer: Self-pay | Admitting: Family Medicine

## 2017-11-28 NOTE — Telephone Encounter (Signed)
Pt returned Lugene's call in regards to his X-Ray.

## 2017-11-28 NOTE — Telephone Encounter (Signed)
Patient advised.

## 2017-12-05 ENCOUNTER — Telehealth: Payer: Self-pay | Admitting: Family Medicine

## 2017-12-05 NOTE — Telephone Encounter (Signed)
Pt returned call about referrak.

## 2017-12-05 NOTE — Telephone Encounter (Signed)
Spoke with pt  He wanted [facility] He is aware  rheumatology will contact him to schedule appointment notes faxed

## 2017-12-05 NOTE — Telephone Encounter (Signed)
Returned pt call  

## 2018-04-10 ENCOUNTER — Ambulatory Visit (INDEPENDENT_AMBULATORY_CARE_PROVIDER_SITE_OTHER)
Admission: RE | Admit: 2018-04-10 | Discharge: 2018-04-10 | Disposition: A | Discharge status: Home / Self Care | Payer: 59 | Source: Ambulatory Visit | Attending: Family Medicine | Admitting: Family Medicine

## 2018-04-10 ENCOUNTER — Encounter: Payer: Self-pay | Admitting: Family Medicine

## 2018-04-10 ENCOUNTER — Other Ambulatory Visit: Payer: Self-pay

## 2018-04-10 ENCOUNTER — Ambulatory Visit (INDEPENDENT_AMBULATORY_CARE_PROVIDER_SITE_OTHER): Payer: 59 | Admitting: Family Medicine

## 2018-04-10 VITALS — BP 122/70 | HR 69 | Temp 98.1°F | Ht 70.0 in | Wt 218.5 lb

## 2018-04-10 DIAGNOSIS — S62358A Nondisplaced fracture of shaft of other metacarpal bone, initial encounter for closed fracture: Secondary | ICD-10-CM | POA: Insufficient documentation

## 2018-04-10 DIAGNOSIS — M79642 Pain in left hand: Secondary | ICD-10-CM | POA: Diagnosis not present

## 2018-04-10 DIAGNOSIS — S62326A Displaced fracture of shaft of fifth metacarpal bone, right hand, initial encounter for closed fracture: Secondary | ICD-10-CM | POA: Diagnosis not present

## 2018-04-10 DIAGNOSIS — S62357A Nondisplaced fracture of shaft of fifth metacarpal bone, left hand, initial encounter for closed fracture: Secondary | ICD-10-CM | POA: Diagnosis not present

## 2018-04-10 NOTE — Progress Notes (Signed)
He fell 04/08/2018.  Tripped on stairs.  [place] on L hand.  He didn't hear a pop or snap.  No bruising.  Swelling soon after the injury.  L hand was prev puffy.  Pain with grip.  Dorsal swelling.  Pain locally at L 5th MC.  Less puffy and painful today but not back to baseline.    Meds, vitals, and allergies reviewed.   ROS: Per HPI unless specifically indicated in ROS section   nad ncat L wrist and forearm with normal inspection.  Wrist not ttp L hand mildly puffy and tender w/o bruising at the L 5th MC shaft Distally NV intact No tendon deficit for ROM in the fingers in the L hand.   MCPs not ttp.  Xray reviewed with patient.

## 2018-04-10 NOTE — Patient Instructions (Signed)
Use the splint for now.  See Rosaria Ferries on the way out.  Take care.  Glad to see you.

## 2018-04-10 NOTE — Assessment & Plan Note (Signed)
D/w pt.  Splinted with gutter splint per routine with normal cautions given.  He felt better in splint.  Distally NV intact in splint.  Refer to ortho.  He agrees with plan.  Work note given. >25 minutes spent in face to face time with patient, >50% spent in counselling or coordination of care.

## 2018-05-08 DIAGNOSIS — S62357D Nondisplaced fracture of shaft of fifth metacarpal bone, left hand, subsequent encounter for fracture with routine healing: Secondary | ICD-10-CM | POA: Diagnosis not present

## 2020-08-27 IMAGING — DX LEFT HAND - COMPLETE 3+ VIEW
3 series · 3 of 3 positions shown · non-contrast
Comparison: None.

CLINICAL DATA: Left hand pain.  Fall on outstretched hand.

EXAM:
LEFT HAND - COMPLETE 3+ VIEW

[hand ap]
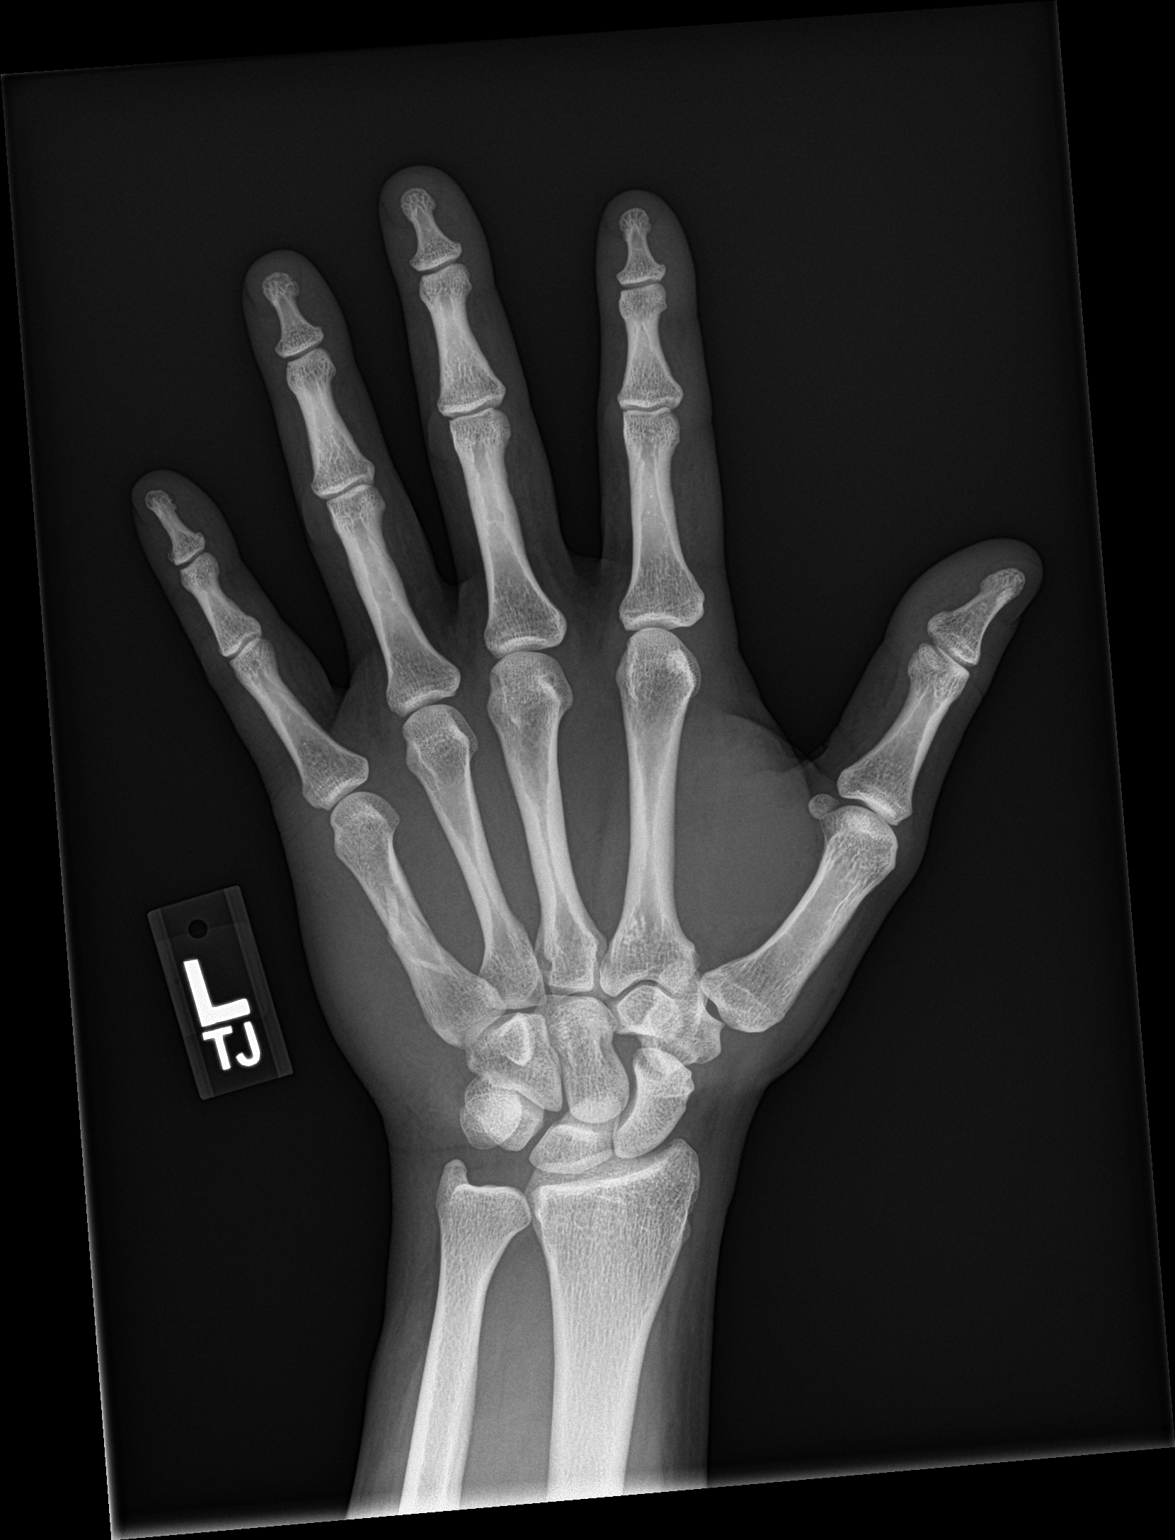

[hand obl]
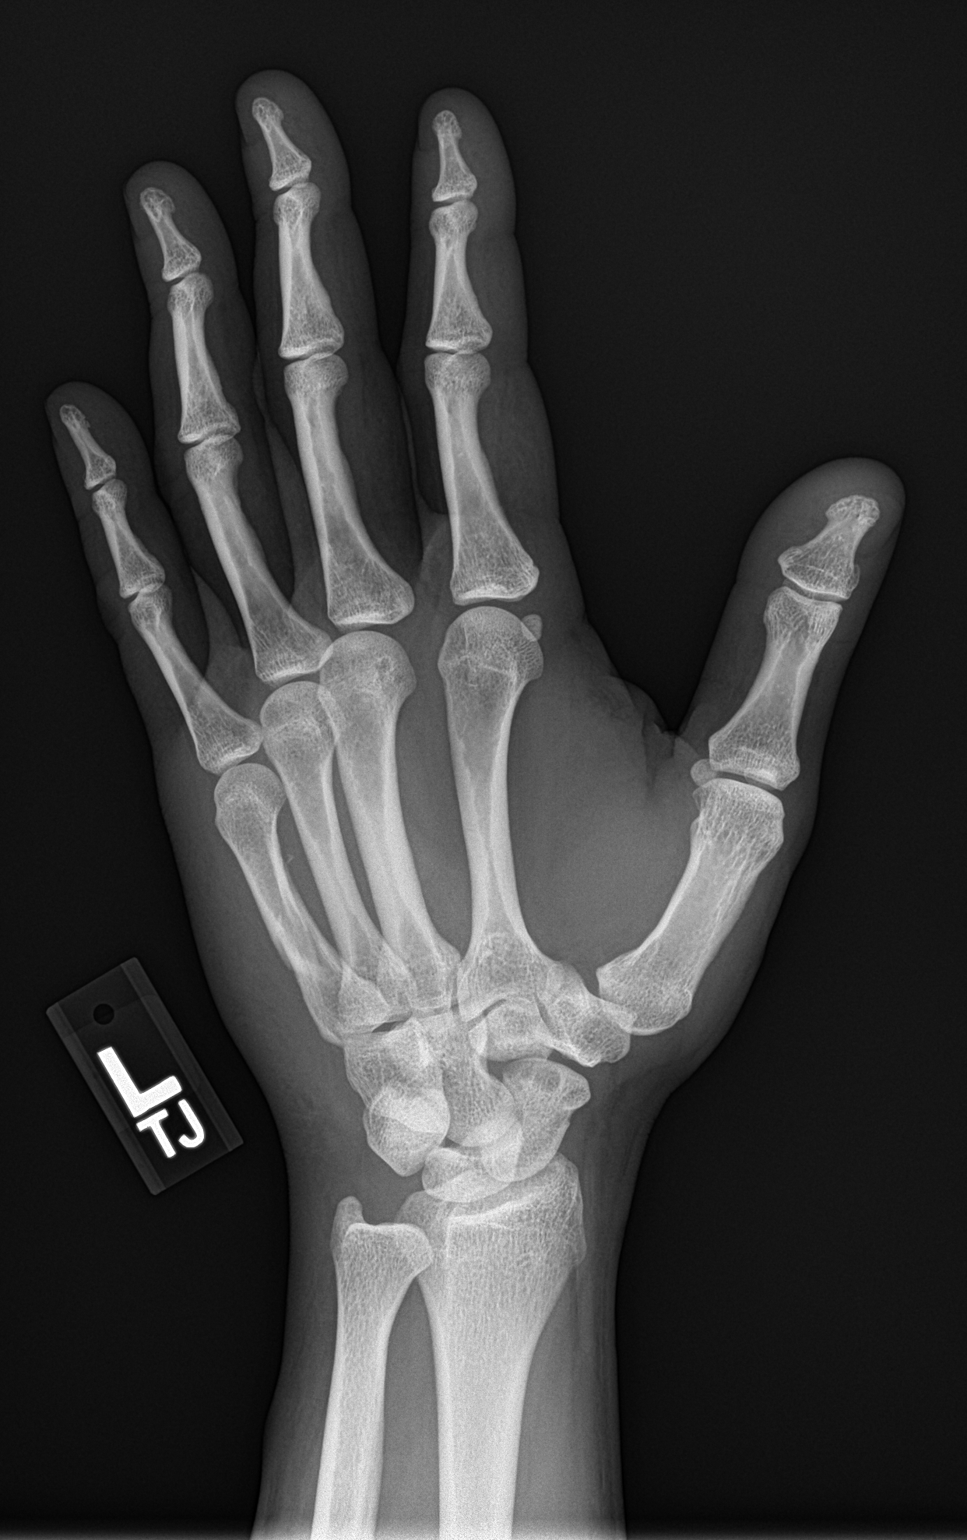

[hand lat]
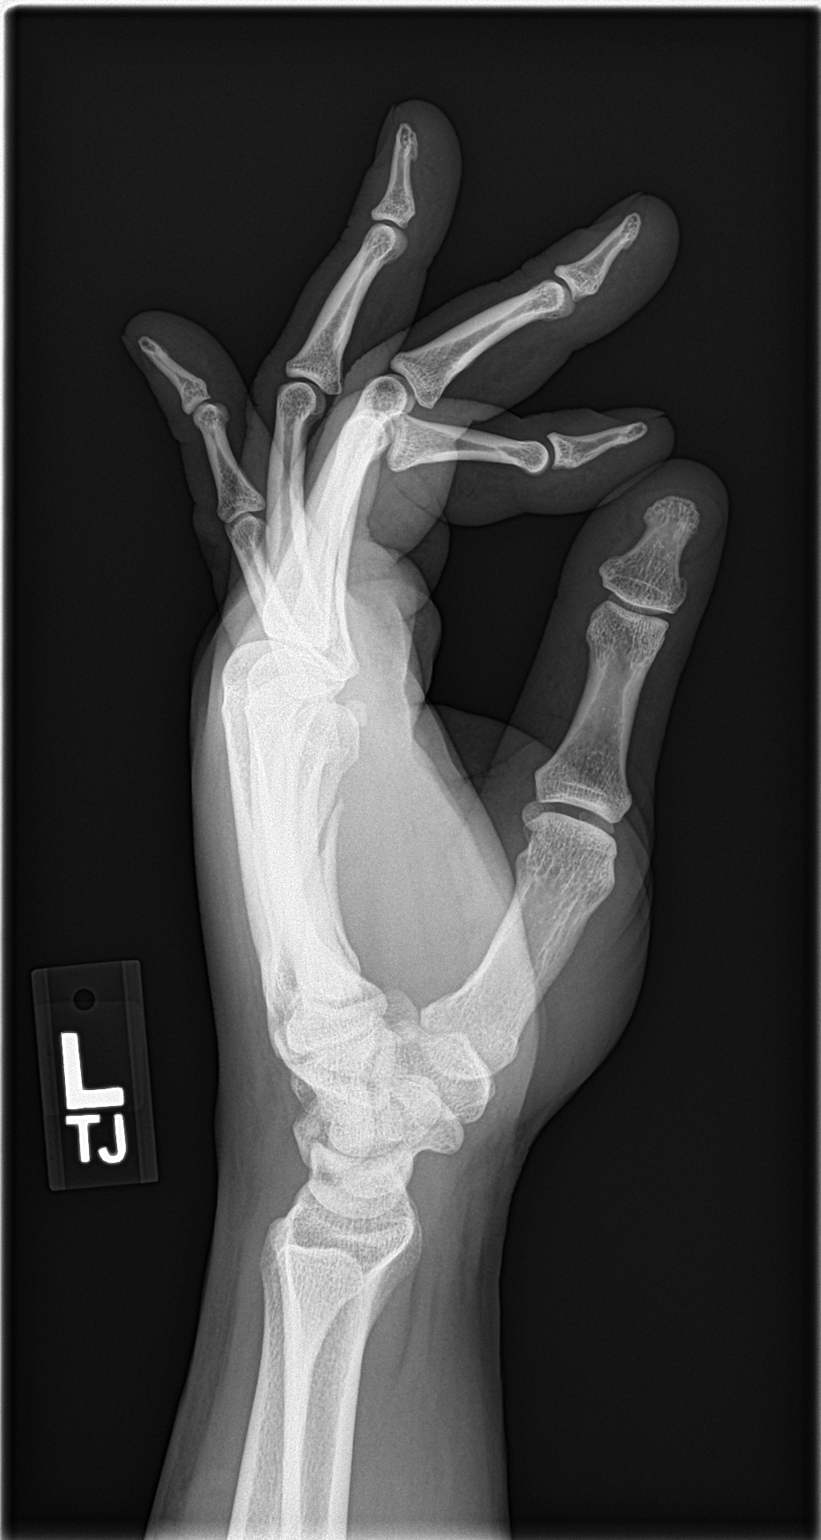

[3 of 3 positions shown; findings below may reference images not displayed]

FINDINGS: There is a nondisplaced, comminuted fracture deformity involving the
proximal and mid shaft of fifth metacarpal bone. No additional
fractures or dislocations identified. No radiopaque foreign bodies.
IMPRESSION: 1. Acute and comminuted fracture deformity involves the proximal and
mid shaft of fifth metacarpal.

## 2021-02-15 ENCOUNTER — Ambulatory Visit: Payer: 59 | Admitting: Family Medicine

## 2021-02-18 ENCOUNTER — Encounter: Payer: Self-pay | Admitting: Family Medicine

## 2021-02-18 ENCOUNTER — Ambulatory Visit (INDEPENDENT_AMBULATORY_CARE_PROVIDER_SITE_OTHER): Payer: BC Managed Care – PPO | Admitting: Family Medicine

## 2021-02-18 ENCOUNTER — Other Ambulatory Visit: Payer: Self-pay

## 2021-02-18 VITALS — BP 104/80 | HR 71 | Temp 98.0°F | Ht 70.0 in | Wt 196.0 lb

## 2021-02-18 DIAGNOSIS — Z0001 Encounter for general adult medical examination with abnormal findings: Secondary | ICD-10-CM | POA: Diagnosis not present

## 2021-02-18 DIAGNOSIS — R195 Other fecal abnormalities: Secondary | ICD-10-CM | POA: Diagnosis not present

## 2021-02-18 DIAGNOSIS — Z0000 Encounter for general adult medical examination without abnormal findings: Secondary | ICD-10-CM

## 2021-02-18 DIAGNOSIS — Z114 Encounter for screening for human immunodeficiency virus [HIV]: Secondary | ICD-10-CM

## 2021-02-18 DIAGNOSIS — Z1159 Encounter for screening for other viral diseases: Secondary | ICD-10-CM

## 2021-02-18 DIAGNOSIS — Z23 Encounter for immunization: Secondary | ICD-10-CM

## 2021-02-18 LAB — CBC WITH DIFFERENTIAL/PLATELET
Basophils Absolute: 0 10*3/uL (ref 0.0–0.1)
Basophils Relative: 0.4 % (ref 0.0–3.0)
Eosinophils Absolute: 0.1 10*3/uL (ref 0.0–0.7)
Eosinophils Relative: 2 % (ref 0.0–5.0)
HCT: 47.4 % (ref 39.0–52.0)
Hemoglobin: 16.4 g/dL (ref 13.0–17.0)
Lymphocytes Relative: 15.2 % (ref 12.0–46.0)
Lymphs Abs: 0.8 10*3/uL (ref 0.7–4.0)
MCHC: 34.5 g/dL (ref 30.0–36.0)
MCV: 91.1 fl (ref 78.0–100.0)
Monocytes Absolute: 0.5 10*3/uL (ref 0.1–1.0)
Monocytes Relative: 8.4 % (ref 3.0–12.0)
Neutro Abs: 4.1 10*3/uL (ref 1.4–7.7)
Neutrophils Relative %: 74 % (ref 43.0–77.0)
Platelets: 193 10*3/uL (ref 150.0–400.0)
RBC: 5.2 Mil/uL (ref 4.22–5.81)
RDW: 13 % (ref 11.5–15.5)
WBC: 5.5 10*3/uL (ref 4.0–10.5)

## 2021-02-18 LAB — COMPREHENSIVE METABOLIC PANEL
ALT: 35 U/L (ref 0–53)
AST: 30 U/L (ref 0–37)
Albumin: 4.7 g/dL (ref 3.5–5.2)
Alkaline Phosphatase: 52 U/L (ref 39–117)
BUN: 12 mg/dL (ref 6–23)
CO2: 32 mEq/L (ref 19–32)
Calcium: 10 mg/dL (ref 8.4–10.5)
Chloride: 105 mEq/L (ref 96–112)
Creatinine, Ser: 1.02 mg/dL (ref 0.40–1.50)
GFR: 97.94 mL/min (ref >60.00)
Glucose, Bld: 91 mg/dL (ref 70–99)
Potassium: 4.4 mEq/L (ref 3.5–5.1)
Sodium: 143 mEq/L (ref 135–145)
Total Bilirubin: 1.1 mg/dL (ref 0.2–1.2)
Total Protein: 7.4 g/dL (ref 6.0–8.3)

## 2021-02-18 NOTE — Patient Instructions (Signed)
Flu and tetanus shot today.  Go to the lab on the way out.   If you have mychart we'll likely use that to update you.    Take care.  Glad to see you. I would cut out all dairy for 2 weeks and see if you notice a change.

## 2021-02-18 NOTE — Progress Notes (Signed)
This visit occurred during the SARS-CoV-2 public health emergency.  Safety protocols were in place, including screening questions prior to the visit, additional usage of staff PPE, and extensive cleaning of exam room while observing appropriate contact time as indicated for disinfecting solutions.  CPE- See plan.  Routine anticipatory guidance given to patient.  See health maintenance.  The possibility exists that previously documented standard health maintenance information may have been brought forward from a previous encounter into this note.  If needed, that same information has been updated to reflect the current situation based on today's encounter.    Tetanus 2023.   Flu 2023 PNA and shingles not due.   Covid vaccine- d/w pt.   Living will d/w pt.   Diet and exercise d/w pt.   Colon and prostate screening not due.   HIV and HCV screening d/w pt.  He opts in.    He had his license suspended in the meantime with court pending.  He isn't drinking.  Living at home.   He is working.  Discussed.  He has loose stools, no total diarrhea but not solid stools.  No abd pan.  Going on for about a few years.  BM about 20min after eating.  No diet restrictions tried so far.  No FCNAVD.  No blood in stool.  Normal urination.  Not having greasy stools.  Not having black or yellow stools, just brown.  Not losing weight.    PMH and SH reviewed  Meds, vitals, and allergies reviewed.   ROS: Per HPI.  Unless specifically indicated otherwise in HPI, the patient denies:  General: fever. Eyes: acute vision changes ENT: sore throat Cardiovascular: chest pain Respiratory: SOB GI: vomiting GU: dysuria Musculoskeletal: acute back pain Derm: acute rash Neuro: acute motor dysfunction Psych: worsening mood Endocrine: polydipsia Heme: bleeding Allergy: hayfever  GEN: nad, alert and oriented HEENT: ncat NECK: supple w/o LA CV: rrr. PULM: ctab, no inc wob ABD: soft, +bs EXT: no edema SKIN:  Well-perfused.

## 2021-02-19 LAB — HEPATITIS C ANTIBODY
Hepatitis C Ab: NONREACTIVE
SIGNAL TO CUT-OFF: 0.08 (ref <1.00)

## 2021-02-19 LAB — CELIAC DISEASE PANEL
(tTG) Ab, IgA: <1 U/mL
(tTG) Ab, IgG: <1 U/mL
Gliadin IgA: <1 U/mL
Gliadin IgG: <1 U/mL
Immunoglobulin A: 275 mg/dL (ref 47–310)

## 2021-02-19 LAB — HIV ANTIBODY (ROUTINE TESTING W REFLEX): HIV 1&2 Ab, 4th Generation: NONREACTIVE

## 2021-02-21 DIAGNOSIS — Z0000 Encounter for general adult medical examination without abnormal findings: Secondary | ICD-10-CM | POA: Insufficient documentation

## 2021-02-21 DIAGNOSIS — R195 Other fecal abnormalities: Secondary | ICD-10-CM | POA: Insufficient documentation

## 2021-02-21 NOTE — Assessment & Plan Note (Signed)
Longstanding, benign abdominal exam.  Reasonable to check celiac labs along with routine labs, see orders.  Reasonable to cut out dairy for 2 weeks and see if he notices a difference in the meantime.

## 2021-02-21 NOTE — Assessment & Plan Note (Signed)
Tetanus 2023.   Flu 2023 PNA and shingles not due.   Covid vaccine- d/w pt.   Living will d/w pt.   Diet and exercise d/w pt.   Colon and prostate screening not due.   HIV and HCV screening d/w pt.  He opts in.

## 2021-07-08 ENCOUNTER — Ambulatory Visit: Payer: Self-pay | Admitting: Family Medicine

## 2021-07-21 ENCOUNTER — Ambulatory Visit (INDEPENDENT_AMBULATORY_CARE_PROVIDER_SITE_OTHER): Payer: 59 | Admitting: Primary Care

## 2021-07-21 ENCOUNTER — Encounter: Payer: Self-pay | Admitting: Primary Care

## 2021-07-21 VITALS — BP 124/76 | HR 62 | Temp 98.6°F | Ht 70.0 in | Wt 193.0 lb

## 2021-07-21 DIAGNOSIS — J020 Streptococcal pharyngitis: Secondary | ICD-10-CM | POA: Diagnosis not present

## 2021-07-21 DIAGNOSIS — J029 Acute pharyngitis, unspecified: Secondary | ICD-10-CM | POA: Diagnosis not present

## 2021-07-21 LAB — POCT RAPID STREP A (OFFICE): Rapid Strep A Screen: POSITIVE — AB

## 2021-07-21 MED ORDER — AMOXICILLIN 500 MG PO CAPS: 500.0000 mg | ORAL_CAPSULE | Freq: Two times a day (BID) | ORAL | 0 refills | Status: AC

## 2021-07-21 NOTE — Progress Notes (Signed)
Subjective:    Patient ID: Randall Ward, male    DOB: [birth year], 31 y.o.   MRN: [id number]  Sore Throat  Pertinent negatives include no congestion, coughing, ear pain, shortness of breath or trouble swallowing.    Randall Ward is a very pleasant 31 y.o. male patient of Dr. Damita Dunnings with a history of acute lymphoid leukemia during childhood who presents today to discuss sore throat.  Symptom onset two days ago with scratchy/sore throat. He proceeded to work that day and began to feel fatigued with body aches, chills, and progressing sore throat so he had to leave.   He's been taking Ibuprofen and throat lozenges with temporary improvement. He was exposed to someone with strep throat a few days prior to symptom onset.   He denies cough, trouble swallowing, post nasal drip, rhinorrhea, abdominal pain.    Review of Systems  Constitutional:  Positive for chills and fatigue. Negative for fever.  HENT:  Positive for sore throat. Negative for congestion, ear pain, sinus pressure and trouble swallowing.   Respiratory:  Negative for cough and shortness of breath.          Past Medical History:  Diagnosis Date   Cancer ([place])    ALL at 56 months, per [facility] [place] [facility]   Ruptured spleen    no surgery    Social History   Socioeconomic History   Marital status: Single    Spouse name: Not on file   Number of children: Not on file   Years of education: Not on file   Highest education level: Not on file  Occupational History   Not on file  Tobacco Use   Smoking status: Never   Smokeless tobacco: Former  Substance and Sexual Activity   Alcohol use: Not Currently   Drug use: No   Sexual activity: Not on file  Other Topics Concern   Not on file  Social History Narrative   [employer] program grad   Worked for [employer] in [place]   Working at [employer] as of 2022   Social Determinants of Radio broadcast assistant Strain: Not on [employer] Insecurity: Not on file   Transportation Needs: Not on file  Physical Activity: Not on file  Stress: Not on file  Social Connections: Not on file  Intimate Partner Violence: Not on file    Past Surgical History:  Procedure Laterality Date   KNEE SURGERY     left, 2012    Family History  Problem Relation Age of Onset   Diabetes Father    Arthritis Father    Heart disease Father    Hypertension Father    Prostate cancer Neg Hx    Colon cancer Neg Hx     No Known Allergies  Current Outpatient Medications on File Prior to Visit  Medication Sig Dispense Refill   Multiple Vitamin (MULTIVITAMIN) tablet Take 1 tablet by mouth daily.     Omega-3 Fatty Acids (FISH OIL) 1000 MG CAPS Take by mouth.     No current facility-administered medications on file prior to visit.    BP 124/76   Pulse 62   Temp 98.6 F (37 C) (Oral)   Ht 5' 10"  (1.778 m)   Wt 193 lb (87.5 kg)   SpO2 97%   BMI 27.69 kg/m  Objective:   Physical Exam Constitutional:      Appearance: He is ill-appearing.  HENT:     Right Ear: Tympanic membrane and  ear canal normal.     Left Ear: Tympanic membrane and ear canal normal.     Nose: No congestion.     Mouth/Throat:     Mouth: Mucous membranes are moist.     Pharynx: Pharyngeal swelling and posterior oropharyngeal erythema present. No oropharyngeal exudate.     Tonsils: No tonsillar exudate or tonsillar abscesses. 2+ on the right. 2+ on the left.  Pulmonary:     Effort: Pulmonary effort is normal.     Breath sounds: Normal breath sounds.  Skin:    General: Skin is warm and dry.  Neurological:     Mental Status: He is alert.           Assessment & Plan:   Problem List Items Addressed This Visit       Respiratory   Acute streptococcal pharyngitis - Primary    Exam today suspicious for strep pharyngitis. He is stable for outpatient treatment.   Rapid strep today positive.  Start Amoxicillin 500 mg BID x 10 days. Continue Ibuprofen PRN.  Return precautions  provided. Work note provided.        Relevant Medications   amoxicillin (AMOXIL) 500 MG capsule   Other Visit Diagnoses     Sore throat       Relevant Orders   Rapid Strep A (Completed)          Pleas Koch, NP

## 2021-07-21 NOTE — Assessment & Plan Note (Signed)
Exam today suspicious for strep pharyngitis. He is stable for outpatient treatment.   Rapid strep today positive.  Start Amoxicillin 500 mg BID x 10 days. Continue Ibuprofen PRN.  Return precautions provided. Work note provided.

## 2021-07-21 NOTE — Patient Instructions (Signed)
Start amoxicillin antibiotics for the infection. Take 1 tablet by mouth twice daily for 10 days.  Continue Ibuprofen for pain and reduction of inflammation.  Please notify us if no improvement in 3-4 days.  It was a pleasure meeting you!

## 2021-07-22 ENCOUNTER — Encounter: Payer: Self-pay | Admitting: Family Medicine

## 2021-07-22 ENCOUNTER — Ambulatory Visit (INDEPENDENT_AMBULATORY_CARE_PROVIDER_SITE_OTHER)
Admission: RE | Admit: 2021-07-22 | Discharge: 2021-07-22 | Disposition: A | Discharge status: Home / Self Care | Payer: 59 | Source: Ambulatory Visit | Attending: Family Medicine | Admitting: Family Medicine

## 2021-07-22 ENCOUNTER — Ambulatory Visit (INDEPENDENT_AMBULATORY_CARE_PROVIDER_SITE_OTHER): Payer: 59 | Admitting: Family Medicine

## 2021-07-22 ENCOUNTER — Telehealth: Payer: Self-pay

## 2021-07-22 VITALS — BP 104/68 | HR 76 | Temp 97.7°F | Ht 70.0 in | Wt 193.0 lb

## 2021-07-22 DIAGNOSIS — M545 Low back pain, unspecified: Secondary | ICD-10-CM

## 2021-07-22 DIAGNOSIS — R1030 Lower abdominal pain, unspecified: Secondary | ICD-10-CM

## 2021-07-22 LAB — URINALYSIS, ROUTINE W REFLEX MICROSCOPIC
Hgb urine dipstick: NEGATIVE
Ketones, ur: NEGATIVE
Leukocytes,Ua: NEGATIVE
Nitrite: NEGATIVE
RBC / HPF: NONE SEEN (ref 0–?)
Specific Gravity, Urine: 1.02 (ref 1.000–1.030)
Total Protein, Urine: NEGATIVE
Urine Glucose: NEGATIVE
Urobilinogen, UA: 1 (ref 0.0–1.0)
pH: 6 (ref 5.0–8.0)

## 2021-07-22 NOTE — Progress Notes (Signed)
Recent tested pos for Strep, recent with ST.  On abx currently.  ST is getting better in the meantime.    Pain x1-2 months.  Was prev helping a friend get up hay.  Felt pain in the R groin.  Then with B lower back pain and either testicle but not at the same time.  No burning with urination. No FCNAVD.  No lump or  mass noted. One night he had sleep disruption o/w pain isn't as bad.  Ibuprofen helps some. No numbness.  No sciatica.   Taking ibuprofen rarely for his back.    Meds, vitals, and allergies reviewed.   ROS: Per HPI unless specifically indicated in ROS section   GEN: nad, alert and oriented HEENT: ncat NECK: supple w/o LA CV: rrr.  PULM: ctab, no inc wob ABD: soft, +bs EXT: no edema SKIN: no acute rash SLR neg B Able to bear weight. Normal external genitalia and testicular exam.  No hernia appreciated bilaterally.

## 2021-07-22 NOTE — Patient Instructions (Signed)
Take up to ibuprofen '600mg'$  up to 3 times a day for pain, with food.  Let me know if that isn't helping.   Go to the lab on the way out.   If you have mychart we'll likely use that to update you.    Take care.  Glad to see you.

## 2021-07-22 NOTE — Telephone Encounter (Signed)
[facility] Night - Client Nonclinical Telephone Record  [facility] Client [facility] Night - Client Client Site [facility] - Night Provider Renford Dills - MD Contact Type Call Who Is Calling Patient / Member / Family / Caregiver Caller Name Isidore Margraf Marion Phone Number [phone] Patient Name Randall Ward Patient DOB [birth year] Call Type Message Only Information Provided Reason for Call Request for General Office Information Initial Comment Caller states he has an appointment at [facility] and is going to be late a couple minutes. Disp. Time Disposition Final User 07/22/2021 7:28:50 AM General Information Provided Yes Candise Bowens Call Closed By: Candise Bowens Transaction Date/Time: 07/22/2021 7:26:24 AM (ET   Per chart review tab pt has already seen Dr Damita Dunnings today.

## 2021-07-25 NOTE — Assessment & Plan Note (Signed)
Given his history of pediatric cancer reasonable to check plain films.  See notes on urinalysis. Take up to ibuprofen '600mg'$  up to 3 times a day for pain, with food.  He can let me know if that isn't helping.  I do not suspect an ominous diagnosis.

## 2021-10-29 ENCOUNTER — Ambulatory Visit (INDEPENDENT_AMBULATORY_CARE_PROVIDER_SITE_OTHER)
Admission: RE | Admit: 2021-10-29 | Discharge: 2021-10-29 | Disposition: A | Discharge status: Home / Self Care | Payer: 59 | Source: Ambulatory Visit | Attending: Family Medicine | Admitting: Family Medicine

## 2021-10-29 ENCOUNTER — Ambulatory Visit (INDEPENDENT_AMBULATORY_CARE_PROVIDER_SITE_OTHER): Payer: 59 | Admitting: Family Medicine

## 2021-10-29 ENCOUNTER — Encounter: Payer: Self-pay | Admitting: Family Medicine

## 2021-10-29 VITALS — BP 112/78 | HR 61 | Temp 97.2°F | Ht 70.0 in | Wt 207.0 lb

## 2021-10-29 DIAGNOSIS — M542 Cervicalgia: Secondary | ICD-10-CM | POA: Diagnosis not present

## 2021-10-29 DIAGNOSIS — R519 Headache, unspecified: Secondary | ICD-10-CM | POA: Diagnosis not present

## 2021-10-29 DIAGNOSIS — R42 Dizziness and giddiness: Secondary | ICD-10-CM | POA: Diagnosis not present

## 2021-10-29 LAB — CBC WITH DIFFERENTIAL/PLATELET
Basophils Absolute: 0 10*3/uL (ref 0.0–0.1)
Basophils Relative: 0.7 % (ref 0.0–3.0)
Eosinophils Absolute: 0.1 10*3/uL (ref 0.0–0.7)
Eosinophils Relative: 2.2 % (ref 0.0–5.0)
HCT: 45.8 % (ref 39.0–52.0)
Hemoglobin: 15.9 g/dL (ref 13.0–17.0)
Lymphocytes Relative: 21.6 % (ref 12.0–46.0)
Lymphs Abs: 1 10*3/uL (ref 0.7–4.0)
MCHC: 34.7 g/dL (ref 30.0–36.0)
MCV: 92.4 fl (ref 78.0–100.0)
Monocytes Absolute: 0.5 10*3/uL (ref 0.1–1.0)
Monocytes Relative: 9.9 % (ref 3.0–12.0)
Neutro Abs: 3.2 10*3/uL (ref 1.4–7.7)
Neutrophils Relative %: 65.6 % (ref 43.0–77.0)
Platelets: 183 10*3/uL (ref 150.0–400.0)
RBC: 4.96 Mil/uL (ref 4.22–5.81)
RDW: 12.6 % (ref 11.5–15.5)
WBC: 4.8 10*3/uL (ref 4.0–10.5)

## 2021-10-29 LAB — BASIC METABOLIC PANEL
BUN: 16 mg/dL (ref 6–23)
CO2: 31 mEq/L (ref 19–32)
Calcium: 9.5 mg/dL (ref 8.4–10.5)
Chloride: 106 mEq/L (ref 96–112)
Creatinine, Ser: 1.07 mg/dL (ref 0.40–1.50)
GFR: 92.02 mL/min (ref >60.00)
Glucose, Bld: 57 mg/dL — ABNORMAL LOW (ref 70–99)
Potassium: 4 mEq/L (ref 3.5–5.1)
Sodium: 142 mEq/L (ref 135–145)

## 2021-10-29 NOTE — Patient Instructions (Signed)
Ibuprofen 400-'600mg'$  up to 3 times a day with food for neck symptoms.  Drink enough fluid to keep your urine clear.  Go to the lab on the way out.   If you have mychart we'll likely use that to update you.    Take care.  Glad to see you.

## 2021-10-29 NOTE — Progress Notes (Unsigned)
Started a few weeks ago with a "loopy" feeling, once upon standing, once while sitting.  No room spinning.  Can get lightheaded occ.  No syncope.  Intermittent.    Separate issue when he put his head back on the headrest in the card he had pain on B jaw area.  Some occipital pain, can be tender to the touch but not severe pain. No traumatic event recently.   No bruise, no rash.    CN 2-12 wnl B, S/S wnl x4    Occipital neuralgia/C2 nerve root irritation/DDD?  Lightheaded not vertigo.  No vertigo on head movement.

## 2021-10-31 DIAGNOSIS — R42 Dizziness and giddiness: Secondary | ICD-10-CM | POA: Insufficient documentation

## 2021-10-31 DIAGNOSIS — R519 Headache, unspecified: Secondary | ICD-10-CM | POA: Insufficient documentation

## 2021-10-31 NOTE — Assessment & Plan Note (Signed)
Normal neurologic exam. He could have occipital neuralgia vs C2 nerve root irritation/DDD.  Discussed with patient.  Check plain films.  At this point okay for outpatient follow-up. Okay to use as needed ibuprofen with routine cautions, see history of his son.

## 2021-10-31 NOTE — Assessment & Plan Note (Signed)
It sounds like he was lightheaded episodically and not having vertigo sx. discussed today well-hydrated check basic labs.

## 2022-02-17 ENCOUNTER — Ambulatory Visit (INDEPENDENT_AMBULATORY_CARE_PROVIDER_SITE_OTHER): Payer: 59 | Admitting: Internal Medicine

## 2022-02-17 ENCOUNTER — Encounter: Payer: Self-pay | Admitting: Internal Medicine

## 2022-02-17 VITALS — BP 110/64 | HR 84 | Temp 98.0°F | Ht 70.0 in | Wt 211.0 lb

## 2022-02-17 DIAGNOSIS — J0110 Acute frontal sinusitis, unspecified: Secondary | ICD-10-CM

## 2022-02-17 MED ORDER — SULFACETAMIDE SODIUM 10 % OP SOLN: 2.0000 [drp] | Freq: Four times a day (QID) | OPHTHALMIC | 0 refills | Status: DC

## 2022-02-17 MED ORDER — AMOXICILLIN 500 MG PO TABS: 1000.0000 mg | ORAL_TABLET | Freq: Two times a day (BID) | ORAL | 0 refills | Status: AC

## 2022-02-17 NOTE — Progress Notes (Signed)
   Subjective:    Patient ID: Randall Ward, male    DOB: February 02, 1990, 32 y.o.   MRN: [id number]  HPI Here due to a respiratory illness  Started almost a week ago Martin Majestic out of town--sore throat, then body aches This resolved--but then congestion in nose and some sore throat Now noticed 2 sig nose bleeds This morning--woke with crust on eyes (and they were red and burning) Has been coughing--just slight mucus  No fever No headache or sinus pressure No ear pain No SOB  Tried nyquil/dayquil--no help Tried mucines--also no good Tylenol   . Current Outpatient Medications on File Prior to Visit  Medication Sig Dispense Refill   Multiple Vitamin (MULTIVITAMIN) tablet Take 1 tablet by mouth daily.     No current facility-administered medications on file prior to visit.    No Known Allergies  Past Medical History:  Diagnosis Date   Cancer ([place])    ALL at 31 months, per [facility] [place]   Ruptured spleen    no surgery    Past Surgical History:  Procedure Laterality Date   KNEE SURGERY     left, 2012    Family History  Problem Relation Age of Onset   Diabetes Father    Arthritis Father    Heart disease Father    Hypertension Father    Prostate cancer Neg Hx    Colon cancer Neg Hx     Social History   Socioeconomic History   Marital status: Single    Spouse name: Not on file   Number of children: Not on file   Years of education: Not on file   Highest education level: Not on file  Occupational History   Not on file  Tobacco Use   Smoking status: Never    Passive exposure: Never   Smokeless tobacco: Former  Substance and Sexual Activity   Alcohol use: Not Currently   Drug use: No   Sexual activity: Not on file  Other Topics Concern   Not on file  Social History Narrative   [employer] program grad   Worked for [employer] in [place]   Working at [employer] as of 2022   Social Determinants of Radio broadcast assistant Strain: Not on [employer]  Insecurity: Not on file  Transportation Needs: Not on file  Physical Activity: Not on file  Stress: Not on file  Social Connections: Not on file  Intimate Partner Violence: Not on file   Review of Systems No loss of smell or taste Slight nausea 1 day--but eating normally No rash No COVID test or vaccines    Objective:   Physical Exam Constitutional:      Appearance: Normal appearance.  HENT:     Head:     Comments: No sinus tenderness    Right Ear: Tympanic membrane and ear canal normal.     Left Ear: Tympanic membrane and ear canal normal.     Nose: Congestion present.  Eyes:     Comments: Mild conjunctival injection  Pulmonary:     Effort: Pulmonary effort is normal.     Breath sounds: Normal breath sounds. No wheezing or rales.  Musculoskeletal:     Cervical back: Neck supple.  Lymphadenopathy:     Cervical: No cervical adenopathy.  Neurological:     Mental Status: He is alert.            Assessment & Plan:

## 2022-02-17 NOTE — Assessment & Plan Note (Signed)
Seems to have had initial viral infection--now more in head Mild conjunctivitis could be from retrograde spread in tear ducts Will go ahead with treatment with amoxil 1000 bid x 7 days Bleph-10 drops for eyes if they don't improve with the oral antibiotic Continue tylenol

## 2022-04-21 ENCOUNTER — Encounter: Payer: Self-pay | Admitting: Family Medicine

## 2022-04-21 NOTE — Telephone Encounter (Signed)
error 

## 2022-05-02 ENCOUNTER — Ambulatory Visit (INDEPENDENT_AMBULATORY_CARE_PROVIDER_SITE_OTHER): Payer: Managed Care, Other (non HMO) | Admitting: Family Medicine

## 2022-05-02 ENCOUNTER — Ambulatory Visit (INDEPENDENT_AMBULATORY_CARE_PROVIDER_SITE_OTHER)
Admission: RE | Admit: 2022-05-02 | Discharge: 2022-05-02 | Disposition: A | Discharge status: Home / Self Care | Payer: Managed Care, Other (non HMO) | Source: Ambulatory Visit | Attending: Family Medicine | Admitting: Family Medicine

## 2022-05-02 ENCOUNTER — Encounter: Payer: Self-pay | Admitting: Family Medicine

## 2022-05-02 VITALS — BP 106/82 | HR 67 | Temp 97.6°F | Ht 70.0 in | Wt 206.0 lb

## 2022-05-02 DIAGNOSIS — M25511 Pain in right shoulder: Secondary | ICD-10-CM | POA: Diagnosis not present

## 2022-05-02 DIAGNOSIS — M79603 Pain in arm, unspecified: Secondary | ICD-10-CM | POA: Diagnosis not present

## 2022-05-02 DIAGNOSIS — M25519 Pain in unspecified shoulder: Secondary | ICD-10-CM | POA: Diagnosis not present

## 2022-05-02 NOTE — Progress Notes (Signed)
L arm sx.  Palmar ulnar side of hand and forearm and upper arm.  Numbness locally.  Grip is still wnl.  This happens in the day.    Occ B arm numbness at night but that feels different from the daytime L arm sx.    Prev imaging with  IMPRESSION: 1. Mild retrolisthesis at C3-C4. 2. Mild degenerative changes at C3-C4 and C5-C6.  He hasn't tried splints on his wrists.    He has a separate issue with diffuse joint aches, knees and elbows.  Feet can feel cold episodically.  Most bothered by shoulder > elbow > knee pain.  History of pediatric cancer and chemotherapy noted.  Meds, vitals, and allergies reviewed.   ROS: Per HPI unless specifically indicated in ROS section   Nad Ncat Neck supple, no LA No arm drop but B shoulder crepitus.   Rrr Ctab Abd soft not ttp Normal cap refill and normal DP pulses B Tinel neg B wrists.  Grip normal bilaterally.  Tissue well-perfused in the upper and lower extremities.

## 2022-05-02 NOTE — Assessment & Plan Note (Signed)
He could have carpal tunnel symptoms or he could have symptoms originating in the neck.  Discussed trial of splints at night and see if he notices a change with that.  He can let me know if that is not helping.  See after visit summary.  He agrees with plan.  Rationale discussed with patient.

## 2022-05-02 NOTE — Patient Instructions (Addendum)
Go to the lab on the way out.   If you have mychart we'll likely use that to update you.    Take care.  Glad to see you. Try using wrist splints at night in the meantime.  Let me know if that is/isn't helping.

## 2022-05-02 NOTE — Assessment & Plan Note (Signed)
See notes on plain films. 

## 2022-05-05 ENCOUNTER — Telehealth: Payer: Self-pay | Admitting: Family Medicine

## 2022-05-05 DIAGNOSIS — M25519 Pain in unspecified shoulder: Secondary | ICD-10-CM

## 2022-05-05 NOTE — Telephone Encounter (Signed)
Pt called stating he saw Duncan's response regarding xray results. Pt asked could [facility] send the referral in for the orthopedic? Call back # [phone]

## 2022-05-06 NOTE — Telephone Encounter (Signed)
Left message on VM that referral was done.

## 2022-05-06 NOTE — Telephone Encounter (Signed)
Referral placed  Thanks!

## 2022-05-06 NOTE — Addendum Note (Signed)
Addended by: Joaquim Nam on: 05/06/2022 12:02 AM   Modules accepted: Orders

## 2022-05-10 ENCOUNTER — Ambulatory Visit (INDEPENDENT_AMBULATORY_CARE_PROVIDER_SITE_OTHER): Payer: Managed Care, Other (non HMO) | Admitting: Orthopedic Surgery

## 2022-05-10 ENCOUNTER — Encounter: Payer: Self-pay | Admitting: Orthopedic Surgery

## 2022-05-10 ENCOUNTER — Other Ambulatory Visit (INDEPENDENT_AMBULATORY_CARE_PROVIDER_SITE_OTHER): Payer: Managed Care, Other (non HMO)

## 2022-05-10 DIAGNOSIS — M25511 Pain in right shoulder: Secondary | ICD-10-CM | POA: Diagnosis not present

## 2022-05-10 DIAGNOSIS — M25512 Pain in left shoulder: Secondary | ICD-10-CM | POA: Diagnosis not present

## 2022-05-10 NOTE — Progress Notes (Signed)
Office Visit Note   Patient: Randall Ward           Date of Birth: 1990-10-11           MRN: [id number] Visit Date: 05/10/2022 Requested by: Joaquim Nam, MD [address] [place],  [place] [address] PCP: Joaquim Nam, MD  Subjective: Chief Complaint  Patient presents with   Left Shoulder - Pain   Right Shoulder - Pain    HPI: Randall Ward is a 32 y.o. male who presents to the office reporting bilateral shoulder pain as well as scapulothoracic crepitus left worse than right.  He states "my whole body hurts".  Does describe some radicular arm pain more on the left than the right.  Reports popping in the shoulders as well as around the skin scapula.  Had leukemia as a child but was recently released from oncologist with normal blood work last fall.  Describes ulnar type symptoms in that left arm radiating down to the forearm.  Also describes bilateral scapular pain and superior pain.  Does physical work in Public affairs consultant.  Also reports neck pain.  The shoulder pain has been chronic but the neck pain is new and the numbness and tingling in the arms is new.  Denies any leg symptoms..                ROS: All systems reviewed are negative as they relate to the chief complaint within the history of present illness.  Patient denies fevers or chills.  Assessment & Plan: Visit Diagnoses:  1. Bilateral shoulder pain, unspecified chronicity     Plan: Impression is neck stiffness with radicular symptoms which may be related to the neck or potentially could be related to some other type of nerve compression either the ulnar nerve or the median nerve at the wrist.  Plan is MRI arthrogram left shoulder to evaluate significant crepitus within the shoulder joint.  There is also a component of scapulothoracic crepitus.  MRI cervical spine to evaluate bilateral radiculopathy longer than 6 weeks duration with failure of conservative treatment.  Nerve conduction study bilateral upper  extremity to evaluate possible ulnar nerve compression particular in that left elbow versus radiculopathy.  Follow-up after the studies.  Follow-Up Instructions: No follow-ups on file.   Orders:  Orders Placed This Encounter  Procedures   XR Shoulder Left   MR Cervical Spine w/o contrast   MR Shoulder Left w/ contrast   Arthrogram   Ambulatory referral to Physical Medicine Rehab   No orders of the defined types were placed in this encounter.     Procedures: No procedures performed   Clinical Data: No additional findings.  Objective: Vital Signs: There were no vitals taken for this visit.  Physical Exam:  Constitutional: Patient appears well-developed HEENT:  Head: Normocephalic Eyes:EOM are normal Neck: Normal range of motion Cardiovascular: Normal rate Pulmonary/chest: Effort normal Neurologic: Patient is alert Skin: Skin is warm Psychiatric: Patient has normal mood and affect  Ortho Exam: Ortho exam demonstrates good cervical spine range of motion flexion chin to chest extension and 35 degrees rotation 60 degrees bilaterally.  Has 5 out of 5 grip EPL FPL interosseous wrist flexion extension bicep triceps and deltoid strength with no definite subluxation of the ulnar nerve left versus right.  No muscle atrophy in the arms.  Radial pulse intact bilaterally.  Does have some scapulothoracic crepitus on both sides left worse than right.  Also has a little bit of  glenohumeral joint crepitus with internal and external rotation of that left shoulder at 90 degrees of abduction.  Rotator cuff strength is excellent infraspinatus supraspinatus and subscap muscle testing.  Negative O'Brien's testing bilaterally negative speeds testing bilaterally.  No discrete AC joint tenderness.  No paresthesias C5-T1.  Shoulder range of motion right and left is 70/100/170 on the left, 180 on the right  Specialty Comments:  No specialty comments available.  Imaging: No results found.   PMFS  History: Patient Active Problem List   Diagnosis Date Noted   Shoulder pain 05/02/2022   Arm pain 05/02/2022   Lightheaded 10/31/2021   Head pain 10/31/2021   Routine general medical examination at a health care facility 02/21/2021   Low back pain 01/04/2016   Skin lesion 09/21/2012   History of cancer 12/24/2010   Past Medical History:  Diagnosis Date   Cancer    ALL at 18 months, per [facility] [place] [facility]   Ruptured spleen    no surgery    Family History  Problem Relation Age of Onset   Diabetes Father    Arthritis Father    Heart disease Father    Hypertension Father    Prostate cancer Neg Hx    Colon cancer Neg Hx     Past Surgical History:  Procedure Laterality Date   KNEE SURGERY     left, 2012   Social History   Occupational History   Not on file  Tobacco Use   Smoking status: Never    Passive exposure: Never   Smokeless tobacco: Former  Substance and Sexual Activity   Alcohol use: Not Currently   Drug use: No   Sexual activity: Not on file

## 2022-05-16 ENCOUNTER — Encounter: Payer: Self-pay | Admitting: Orthopedic Surgery

## 2022-05-19 ENCOUNTER — Encounter: Payer: Self-pay | Admitting: Orthopedic Surgery

## 2022-05-30 ENCOUNTER — Encounter: Payer: Self-pay | Admitting: Orthopedic Surgery

## 2022-06-10 ENCOUNTER — Ambulatory Visit
Admission: RE | Admit: 2022-06-10 | Discharge: 2022-06-10 | Disposition: A | Discharge status: Home / Self Care | Payer: Managed Care, Other (non HMO) | Source: Ambulatory Visit | Attending: Orthopedic Surgery | Admitting: Orthopedic Surgery

## 2022-06-10 DIAGNOSIS — M25511 Pain in right shoulder: Secondary | ICD-10-CM

## 2022-06-10 MED ORDER — IOPAMIDOL (ISOVUE-M 200) INJECTION 41%
15.0000 mL | Freq: Once | INTRAMUSCULAR | Status: AC
  Administered 2022-06-10: 15 mL via INTRA_ARTICULAR

## 2022-06-15 ENCOUNTER — Encounter: Payer: Self-pay | Admitting: Orthopedic Surgery

## 2022-06-15 ENCOUNTER — Ambulatory Visit (INDEPENDENT_AMBULATORY_CARE_PROVIDER_SITE_OTHER): Payer: Managed Care, Other (non HMO) | Admitting: Orthopedic Surgery

## 2022-06-15 ENCOUNTER — Other Ambulatory Visit (INDEPENDENT_AMBULATORY_CARE_PROVIDER_SITE_OTHER): Payer: Managed Care, Other (non HMO)

## 2022-06-15 DIAGNOSIS — M25512 Pain in left shoulder: Secondary | ICD-10-CM

## 2022-06-15 DIAGNOSIS — M25511 Pain in right shoulder: Secondary | ICD-10-CM

## 2022-06-15 NOTE — Progress Notes (Signed)
Office Visit Note   Patient: Randall Ward           Date of Birth: [birth year]           MRN: [id number] Visit Date: 06/15/2022 Requested by: Joaquim Nam, MD [address] [place],  [place] [address] PCP: Joaquim Nam, MD  Subjective: Chief Complaint  Patient presents with   Other     Scan review    HPI: Randall Ward is a 32 y.o. male who presents to the office reporting bilateral arm numbness and tingling along with leg numbness and tingling as well as left shoulder pain.  Since he was last seen has had an MRI scan of the left shoulder which is reviewed but not read yet.  On my review there is no rotator cuff tear no biceps tendon or labral problem and no significant AC joint arthritis.  C-spine MRI demonstrates mild multilevel cervical spondylosis with no significant canal or foraminal stenosis with discogenic reactive endplate changes with mild marrow edema around C3-4 C5-6 and C6-7.  Patient reports some popping in both shoulders localizing to the [facility] joint.  He also describes leg symptoms when his right leg goes numb as well as the left one at times.  Is radicular in nature down to the ankles but very episodic..                ROS: All systems reviewed are negative as they relate to the chief complaint within the history of present illness.  Patient denies fevers or chills.  Assessment & Plan: Visit Diagnoses:  1. Bilateral shoulder pain, unspecified chronicity     Plan: Impression is shoulder MRI scan which shows no definite operative pathology.  Neck MRI scan does not show anything compressive to account for his bilateral arm radicular symptoms.  He also is describing bilateral leg numbness.  Could be something systemic like B12 deficiency.  That level was checked today.  Lumbar spine radiographs unremarkable in terms of the vertebral bodies.  Pedicles are little bit harder to evaluate.  Based on long duration of radicular symptoms in both legs MRI lumbar spine  indicated to rule out pathology particularly with his history of lymphoma.  Refer to neurology for systemic paresthesias unless laboratory values and or MRI scans are positive.  Plan to let him know about those results when they come in.  Follow-Up Instructions: No follow-ups on file.   Orders:  Orders Placed This Encounter  Procedures   XR Lumbar Spine 2-3 Views   MR Lumbar Spine w/o contrast   Vitamin B12   Ambulatory referral to Neurology   No orders of the defined types were placed in this encounter.     Procedures: No procedures performed   Clinical Data: No additional findings.  Objective: Vital Signs: There were no vitals taken for this visit.  Physical Exam:  Constitutional: Patient appears well-developed HEENT:  Head: Normocephalic Eyes:EOM are normal Neck: Normal range of motion Cardiovascular: Normal rate Pulmonary/chest: Effort normal Neurologic: Patient is alert Skin: Skin is warm Psychiatric: Patient has normal mood and affect  Ortho Exam: Ortho exam demonstrates palpable pedal pulses and radial pulses.  No definite paresthesias in the hands or feet today.  No nerve root tension signs in the lower extremities.  Negative clonus.  No groin pain with internal/external Tatian of the legs.  No muscle atrophy in the legs.  Has very good strength ankle dorsiflexion plantarflexion quad and hamstring strength.  Bilateral upper  extremities demonstrate normal sensation C5-T1 and lower extremities have normal sensation L1-S1.  No muscle atrophy in the arms.  Neck range of motion is full.  Has a little bit of popping with passive range of motion of both shoulders but localizes to the [facility] joint.  Specialty Comments:  No specialty comments available.  Imaging: No results found.   PMFS History: Patient Active Problem List   Diagnosis Date Noted   Shoulder pain 05/02/2022   Arm pain 05/02/2022   Lightheaded 10/31/2021   Head pain 10/31/2021   Routine general medical  examination at a health care facility 02/21/2021   Low back pain 01/04/2016   Skin lesion 09/21/2012   History of cancer 12/24/2010   Past Medical History:  Diagnosis Date   Cancer (HCC)    ALL at 18 months, per [facility] [place] [facility]   Ruptured spleen    no surgery    Family History  Problem Relation Age of Onset   Diabetes Father    Arthritis Father    Heart disease Father    Hypertension Father    Prostate cancer Neg Hx    Colon cancer Neg Hx     Past Surgical History:  Procedure Laterality Date   KNEE SURGERY     left, 2012   Social History   Occupational History   Not on file  Tobacco Use   Smoking status: Never    Passive exposure: Never   Smokeless tobacco: Former  Substance and Sexual Activity   Alcohol use: Not Currently   Drug use: No   Sexual activity: Not on file

## 2022-06-16 ENCOUNTER — Encounter: Payer: Self-pay | Admitting: Neurology

## 2022-06-17 LAB — VITAMIN B12: Vitamin B-12: 616 pg/mL (ref 200–1100)

## 2022-06-20 NOTE — Progress Notes (Signed)
Please call vitamin B12 level is fine.  Thanks

## 2022-06-30 ENCOUNTER — Ambulatory Visit
Admission: RE | Admit: 2022-06-30 | Discharge: 2022-06-30 | Disposition: A | Discharge status: Home / Self Care | Payer: Managed Care, Other (non HMO) | Source: Ambulatory Visit | Attending: Orthopedic Surgery | Admitting: Orthopedic Surgery

## 2022-06-30 DIAGNOSIS — M25511 Pain in right shoulder: Secondary | ICD-10-CM

## 2022-07-08 ENCOUNTER — Telehealth: Payer: Self-pay

## 2022-07-08 NOTE — Progress Notes (Signed)
I called and left a message.  Can you refer him to neurology.  Thanks lumbar spine MRI not diagnostic

## 2022-07-08 NOTE — Telephone Encounter (Signed)
-----   Message from Cammy Copa, MD sent at 07/08/2022  3:54 PM EDT ----- I called and left a message.  Can you refer him to neurology.  Thanks lumbar spine MRI not diagnostic

## 2022-07-11 NOTE — Telephone Encounter (Signed)
Looks like pt has neurology appt on 7/9

## 2022-07-25 NOTE — Progress Notes (Unsigned)
[facility] Neurology Division Clinic Note - Initial Visit   Date: 07/26/2022   Randall Ward MRN: [id number] DOB: [birth year]   Dear Dr. August Saucer:  Thank you for your kind referral of Randall Ward for consultation of numbness/tingling. Although his history is well known to you, please allow [place] to reiterate it for the purpose of our medical record. The patient was accompanied to the clinic by self.   Randall Ward is a 32 y.o. 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   IMPRESSION/PLAN: Bilateral arm paresthesias, worse on the left may suggest entrapment neuropathy, ?ulnar neuropathy at the elbow.    - NCS/EMG LEFT arm   2.  Right leg paresthesias with prolonged sitting could also be seen with transient nerve compression.  With him experiencing numbness with sitting, it's possible he may have mild and transient compression of the sciatic nerve.   - NCS/EMG of the RIGHT leg  3.  Probable cervicogenic dizziness  - Start neck PT  - MRI brain can be considered going forward.  Return to clinic in 3 months   ------------------------------------------------------------- History of present illness: For the past 2-3 years, he has noticed that both arms can fall asleep, which can be alleviated with repositioning.  Sometimes during the daytime, he findings that his left medial forearm and hand can become numb.   He also finds that crossing his legs or sitting, Ward as when driving, his right lower leg can become numb. Stretching can relieve symptoms. MRI lumbar spine did not show any significant disease.   He denies weakness, imbalance, or falls. He endorses chronic neck pain.  MRI cervical spine 06/12/2022 shows mild multilevel cervical spondylosis without impingement.   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   Out-side paper records, electronic medical record, and images have been  reviewed where available and summarized as:  MRI cervical spine 06/12/2022: 1. Mild multilevel cervical spondylosis as detailed above. No significant canal or foraminal stenosis or overt neural impingement. 2. Discogenic reactive endplate change with mild marrow edema about the C3-4, C5-6, and C6-7 interspaces. Finding could contribute to underlying neck pain.  MRI lumbar spine 07/07/2022: 1. No evidence of fracture or bone lesion. 2. No significant disc disease. 3. Mild bilateral facet joint arthropathy at L4-L5 and L5-S1.  Lab Results  Component Value Date   VITAMINB12 616 06/15/2022    Past Medical History:  Diagnosis Date   Cancer (HCC)    ALL at 18 months, per [facility] [place] [facility]   Ruptured spleen    no surgery    Past Surgical History:  Procedure Laterality Date   KNEE SURGERY     left, 2012     Medications:  Outpatient Encounter Medications as of 07/26/2022  Medication Sig   Multiple Vitamin (MULTIVITAMIN) tablet Take 1 tablet by mouth daily.   No facility-administered encounter medications on file as of 07/26/2022.    Allergies: No Known Allergies  Family History: Family History  Problem Relation Age of Onset   Thyroid disease Mother    Arthritis Mother    Diabetes Father    Arthritis Father    Heart disease Father    Hypertension Father    Arthritis Maternal Grandfather    Heart Problems Maternal Grandfather    Heart Problems Paternal Grandmother    Heart Problems Paternal Grandfather    Prostate cancer Neg Hx    Colon cancer Neg Hx     Social  History: Social History   Tobacco Use   Smoking status: Never    Passive exposure: Never   Smokeless tobacco: Current    Types: Chew  Substance Use Topics   Alcohol use: Yes    Comment: Drinks about one beer a day   Drug use: No   Social History   Social History Narrative   [employer] program grad   Worked for [employer] in [place]   Working at [employer] as of 2022      Are you right handed or left  handed? Right Handed    Are you currently employed ? 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 Yes    Who lives with you? No one    What type of home do you live in: 1 story or 2 story? Lives in a one story home        Vital Signs:  BP 125/78   Pulse 71   Ht 5\' 10"  (1.778 m)   Wt 201 lb (91.2 kg)   SpO2 99%   BMI 28.84 kg/m    Neurological Exam: MENTAL STATUS including orientation to time, place, person, recent and remote memory, attention span and concentration, language, and fund of knowledge is normal.  Speech is not dysarthric.  CRANIAL NERVES: II:  No visual field defects.     III-IV-VI: Pupils equal round and reactive to light.  Normal conjugate, extra-ocular eye movements in all directions of gaze.  No nystagmus.  No ptosis.   V:  Normal facial sensation.    VII:  Normal facial symmetry and movements.   VIII:  Normal hearing and vestibular function.   IX-X:  Normal palatal movement.   XI:  Normal shoulder shrug and head rotation.   XII:  Normal tongue strength and range of motion, no deviation or fasciculation.  MOTOR:  No atrophy, fasciculations or abnormal movements.  No pronator drift.   Upper Extremity:  Right  Left  Deltoid  5/5   5/5   Biceps  5/5   5/5   Triceps  5/5   5/5   Infraspinatus 5/5  5/5  Medial pectoralis 5/5  5/5  Wrist extensors  5/5   5/5   Wrist flexors  5/5   5/5   Finger extensors  5/5   5/5   Finger flexors  5/5   5/5   Dorsal interossei  5/5   5/5   Abductor pollicis  5/5   5/5   Tone (Ashworth scale)  0  0   Lower Extremity:  Right  Left  Hip flexors  5/5   5/5   Hip extensors  5/5   5/5   Adductor 5/5  5/5  Abductor 5/5  5/5  Knee flexors  5/5   5/5   Knee extensors  5/5   5/5   Dorsiflexors  5/5   5/5   Plantarflexors  5/5   5/5   Toe extensors  5/5   5/5   Toe flexors  5/5   5/5   Tone (Ashworth scale)  0  0   MSRs:                                           Right         Left brachioradialis 2+  2+  biceps 2+  2+  triceps 2+  2+  patellar 2+  2+  ankle jerk 2+  2+  Hoffman no  no  plantar response down  down   SENSORY:  Normal and symmetric perception of light touch, pinprick, vibration, and temperature. Tinel's sign is negative at the wrists and elbows.    COORDINATION/GAIT: Normal finger-to- nose-finger.  Intact rapid alternating movements bilaterally.  Gait narrow based and stable. Tandem and stressed gait intact.    Thank you for allowing me to participate in patient's care.  If I can answer any additional questions, I would be pleased to do so.    Sincerely,    Shrihan Putt K. Allena Katz, DO

## 2022-07-26 ENCOUNTER — Encounter: Payer: Self-pay | Admitting: Neurology

## 2022-07-26 ENCOUNTER — Ambulatory Visit (INDEPENDENT_AMBULATORY_CARE_PROVIDER_SITE_OTHER): Payer: Managed Care, Other (non HMO) | Admitting: Neurology

## 2022-07-26 ENCOUNTER — Ambulatory Visit: Payer: Managed Care, Other (non HMO) | Admitting: Neurology

## 2022-07-26 VITALS — BP 125/78 | HR 71 | Ht 70.0 in | Wt 201.0 lb

## 2022-07-26 DIAGNOSIS — R200 Anesthesia of skin: Secondary | ICD-10-CM | POA: Diagnosis not present

## 2022-07-26 DIAGNOSIS — R202 Paresthesia of skin: Secondary | ICD-10-CM | POA: Diagnosis not present

## 2022-07-26 DIAGNOSIS — M542 Cervicalgia: Secondary | ICD-10-CM | POA: Diagnosis not present

## 2022-07-26 NOTE — Patient Instructions (Signed)
Try to avoid over bending at the elbow and leaning on your elbows  Nerve testing of the left arm and right leg  Start neck PT.  We will place a referral to [facility] in [place].  Return to clinic in 3 months

## 2022-08-04 ENCOUNTER — Ambulatory Visit (INDEPENDENT_AMBULATORY_CARE_PROVIDER_SITE_OTHER): Payer: Managed Care, Other (non HMO) | Admitting: Neurology

## 2022-08-04 DIAGNOSIS — R202 Paresthesia of skin: Secondary | ICD-10-CM

## 2022-08-04 DIAGNOSIS — R200 Anesthesia of skin: Secondary | ICD-10-CM

## 2022-08-04 DIAGNOSIS — G5622 Lesion of ulnar nerve, left upper limb: Secondary | ICD-10-CM

## 2022-08-04 DIAGNOSIS — G5602 Carpal tunnel syndrome, left upper limb: Secondary | ICD-10-CM

## 2022-08-04 NOTE — Procedures (Signed)
[facility] Neurology  [address] [place], [address]  [place], [place] [address] Tel: [phone] Fax: [phone] Test Date:  08/04/2022  Patient: Randall Ward DOB: 04-May-1990 Physician: Nita Sickle, DO  Sex: Male Height: 5\' 10"  Ref Phys: Nita Sickle, DO  ID#: [id number]   Technician:    History: This is a 32 year old man with bilateral arm and right leg paresthesias.  NCV & EMG Findings: Extensive electrodiagnostic testing of the right upper extremity and left lower extremity shows:  Left median and ulnar sensory responses are within normal limits.  Left mixed palmar sensory response is mildly prolonged.  Right sural and superficial peroneal sensory responses are within normal limits. Left median, right peroneal, and right tibial motor responses are within normal limits.  Left ulnar motor response shows slowed conduction velocity across the elbow (A Elbow-B Elbow, 48 m/s).   Right tibial H reflex study is within normal limits.   Chronic motor axon loss changes are seen affecting the left ulnar innervated muscles, without accompanying active denervation.   Needle electrode examination of the left right lower extremity is within normal limits.     Impression: Left ulnar neuropathy with slowing across the elbow, demyelinating, moderate. Left median neuropathy at or distal to the wrist, consistent with a clinical diagnosis of carpal tunnel syndrome.  Overall, these findings are very mild in degree electrically. There is no evidence of a lumbosacral radiculopathy or large fiber sensorimotor polyneuropathy affecting the right lower extremity.   ___________________________ Nita Sickle, DO    Nerve Conduction Studies   Stim Site NR Peak (ms) Norm Peak (ms) O-P Amp (V) Norm O-P Amp  Left Median Anti Sensory (2nd Digit)  32 C  Wrist    2.9 <3.4 28.7 >20  Right Sup Peroneal Anti Sensory (Ant Lat Mall)  32 C  12 cm    2.3 <4.5 15.5 >5  Right Sural Anti Sensory (Lat Mall)  32 C   Calf    2.5 <4.5 22.2 >5  Left Ulnar Anti Sensory (5th Digit)  32 C  Wrist    2.9 <3.1 20.7 >12     Stim Site NR Onset (ms) Norm Onset (ms) O-P Amp (mV) Norm O-P Amp Site1 Site2 Delta-0 (ms) Dist (cm) Vel (m/s) Norm Vel (m/s)  Left Median Motor (Abd Poll Brev)  32 C  Wrist    3.5 <3.9 10.0 >6 Elbow Wrist 5.1 29.0 57 >50  Elbow    8.6  9.6         Right Peroneal Motor (Ext Dig Brev)  32 C  Ankle    2.9 <5.5 10.6 >3 B Fib Ankle 7.5 39.0 52 >40  B Fib    10.4  10.0  Poplt B Fib 1.5 10.0 67 >40  Poplt    11.9  9.7         Right Tibial Motor (Abd Hall Brev)  32 C  Ankle    3.3 <6.0 18.7 >8 Knee Ankle 8.9 42.0 47 >40  Knee    12.2  14.2         Left Ulnar Motor (Abd Dig Minimi)  32 C  Wrist    2.7 <3.1 8.4 >7 B Elbow Wrist 3.4 23.0 68 >50  B Elbow    6.1  8.0  A Elbow B Elbow 2.1 10.0 *48 >50  A Elbow    8.2  8.0            Stim Site NR Peak (ms) Norm Peak (ms) P-T  Amp (V) Site1 Site2 Delta-P (ms) Norm Delta (ms)  Left Median/Ulnar Palm Comparison (Wrist - 8cm)  32 C  Median Palm    2.0 <2.2 46.3 Median Palm Ulnar Palm *0.7   Ulnar Palm    1.3 <2.2 19.7       Electromyography   Side Muscle Ins.Act Fibs Fasc Recrt Amp Dur Poly Activation Comment  Right AntTibialis Nml Nml Nml Nml Nml Nml Nml Nml N/A  Right Gastroc Nml Nml Nml Nml Nml Nml Nml Nml N/A  Right Flex Dig Long Nml Nml Nml Nml Nml Nml Nml Nml N/A  Right RectFemoris Nml Nml Nml Nml Nml Nml Nml Nml N/A  Right BicepsFemS Nml Nml Nml Nml Nml Nml Nml Nml N/A  Right GluteusMed Nml Nml Nml Nml Nml Nml Nml Nml N/A  Left 1stDorInt Nml Nml Nml *1- *1+ *1+ *1+ Nml N/A  Left Abd Poll Brev Nml Nml Nml Nml Nml Nml Nml Nml N/A  Left PronatorTeres Nml Nml Nml Nml Nml Nml Nml Nml N/A  Left Biceps Nml Nml Nml Nml Nml Nml Nml Nml N/A  Left Triceps Nml Nml Nml Nml Nml Nml Nml Nml N/A  Left Deltoid Nml Nml Nml Nml Nml Nml Nml Nml N/A  Left Abd Dig Min Nml Nml Nml *1- *1+ *1+ *1+ Nml N/A  Left FlexCarpiUln Nml Nml Nml *1- *1+ *1+ *1+  Nml N/A      Waveforms:

## 2022-10-24 ENCOUNTER — Encounter: Payer: Self-pay | Admitting: Family Medicine

## 2022-10-24 ENCOUNTER — Ambulatory Visit (INDEPENDENT_AMBULATORY_CARE_PROVIDER_SITE_OTHER): Payer: Managed Care, Other (non HMO) | Admitting: Family Medicine

## 2022-10-24 VITALS — BP 118/70 | HR 79 | Temp 98.2°F | Ht 70.0 in | Wt 194.0 lb

## 2022-10-24 DIAGNOSIS — R300 Dysuria: Secondary | ICD-10-CM | POA: Insufficient documentation

## 2022-10-24 LAB — POC URINALSYSI DIPSTICK (AUTOMATED)
Bilirubin, UA: NEGATIVE
Blood, UA: NEGATIVE
Glucose, UA: NEGATIVE
Ketones, UA: NEGATIVE
Leukocytes, UA: NEGATIVE
Nitrite, UA: NEGATIVE
Protein, UA: NEGATIVE
Spec Grav, UA: 1.015 (ref 1.010–1.025)
Urobilinogen, UA: 0.2 U/dL
pH, UA: 6 (ref 5.0–8.0)

## 2022-10-24 MED ORDER — SOLIFENACIN SUCCINATE 5 MG PO TABS
5.0000 mg | ORAL_TABLET | Freq: Every day | ORAL | 1 refills | Status: DC
Start: 2022-10-24 — End: 2023-01-10

## 2022-10-24 NOTE — Patient Instructions (Signed)
Try vesicare in the meantime and you should get a call about seeing urology.  Take care.  Glad to see you. Update [place] here at clinic if needed.

## 2022-10-24 NOTE — Assessment & Plan Note (Signed)
Could be from OAB vs caffeine vs BOO/stricture.  D/w pt about ddx and options.  Discussed caffeine taper.   Can start vesicare and refer to uro.  He agrees with plan.  Benign abd exam and and u/a d/w pt.

## 2022-10-24 NOTE — Progress Notes (Signed)
Dysuria, pain and pressure with urination.  Sx started about a few weeks ago.  H/o OAB with improved sx but dry mouth on ditropan prev.  Had seen urology prev.  Urgency in the AM with pressure but slower stream.  Would have lower abd discomfort at the time- that can also happen later in the day.  Drinking some coffee/caffeine.  He feels like he has some incomplete voiding.  No sx now.   Meds, vitals, and allergies reviewed.   ROS: Per HPI unless specifically indicated in ROS section   Nad Ncat Neck supple, no LA Rrr Ctab Abd soft. Not ttp Normal BS Skin well perfused.   Prev urology notes d/w pt about plan for f/u meds and possible cystoscopy if needed.

## 2022-10-27 ENCOUNTER — Encounter: Payer: Self-pay | Admitting: *Deleted

## 2022-11-21 ENCOUNTER — Ambulatory Visit: Payer: Managed Care, Other (non HMO) | Admitting: Neurology

## 2023-01-10 ENCOUNTER — Encounter: Payer: Self-pay | Admitting: Family Medicine

## 2023-01-10 ENCOUNTER — Ambulatory Visit: Payer: Managed Care, Other (non HMO) | Admitting: Family Medicine

## 2023-01-10 VITALS — BP 136/82 | HR 72 | Temp 98.0°F | Ht 70.0 in | Wt 203.0 lb

## 2023-01-10 DIAGNOSIS — R300 Dysuria: Secondary | ICD-10-CM | POA: Diagnosis not present

## 2023-01-10 DIAGNOSIS — R319 Hematuria, unspecified: Secondary | ICD-10-CM

## 2023-01-10 DIAGNOSIS — N390 Urinary tract infection, site not specified: Secondary | ICD-10-CM

## 2023-01-10 DIAGNOSIS — T63441A Toxic effect of venom of bees, accidental (unintentional), initial encounter: Secondary | ICD-10-CM | POA: Diagnosis not present

## 2023-01-10 LAB — POC URINALSYSI DIPSTICK (AUTOMATED)
Bilirubin, UA: NEGATIVE
Blood, UA: 2
Glucose, UA: NEGATIVE
Ketones, UA: POSITIVE
Nitrite, UA: NEGATIVE
Protein, UA: POSITIVE — AB
Spec Grav, UA: 1.015 (ref 1.010–1.025)
Urobilinogen, UA: NEGATIVE U/dL — AB
pH, UA: 6.5 (ref 5.0–8.0)

## 2023-01-10 MED ORDER — SULFAMETHOXAZOLE-TRIMETHOPRIM 800-160 MG PO TABS: 1.0000 | ORAL_TABLET | Freq: Two times a day (BID) | ORAL | 0 refills | Status: DC

## 2023-01-10 MED ORDER — SOLIFENACIN SUCCINATE 10 MG PO TABS
10.0000 mg | ORAL_TABLET | Freq: Every day | ORAL | Status: AC
Start: 2023-01-10 — End: ?

## 2023-01-10 MED ORDER — EPINEPHRINE 0.3 MG/0.3ML IJ SOAJ: 0.3000 mg | INTRAMUSCULAR | 1 refills | Status: AC | PRN

## 2023-01-10 NOTE — Patient Instructions (Signed)
Drink plenty of water and start the antibiotics today.  We'll contact you with your lab report.  Take care.   

## 2023-01-10 NOTE — Progress Notes (Signed)
Urinated this AM at [facility] and no symptoms at that point.  Then had blood in urine, burning with urination, incomplete voiding with subsequent voiding attempt.  Hematuria is new.    H/o urology eval re: dysuria w/o prev hematuria.  Normal cystoscopy 2024.    Recent neg GC/Chlam.  No new exposures.  No FCNAVD.    FH renal stones but no personal hx.  He isn't having back or flank pain.    [place].  He was previously stung on the temporal area. Then had swelling in the upper eyelid. No throat or airway sx.  Cautions d/w pt.  D/w pt about epipen with cautions, ie dial 911 if used.  No sx or swelling now.  He had a picture on his phone showing previous eyelid swelling.  Meds, vitals, and allergies reviewed.   ROS: Per HPI unless specifically indicated in ROS section   nad Ncat Neck supple, no LA Rrr Ctab Abd soft, not ttp.  No CVA pain. Skin well-perfused.

## 2023-01-11 LAB — URINE CULTURE
MICRO NUMBER:: 15888343
Result:: NO GROWTH
SPECIMEN QUALITY:: ADEQUATE

## 2023-01-13 DIAGNOSIS — T63441A Toxic effect of venom of bees, accidental (unintentional), initial encounter: Secondary | ICD-10-CM | POA: Insufficient documentation

## 2023-01-13 NOTE — Assessment & Plan Note (Signed)
History of, prescription sent for EpiPen with routine cautions.  No symptoms now.  He did not have anaphylactic symptoms and it sounded like he only had local swelling previously, but we sent the EpiPen just in case.

## 2023-01-13 NOTE — Assessment & Plan Note (Signed)
Discussed options.  Urinalysis discussed with patient.  See urine culture. Presumed UTI.  Start Septra in the meantime.  Routine cautions given to patient.

## 2023-02-21 ENCOUNTER — Ambulatory Visit: Payer: Managed Care, Other (non HMO) | Admitting: Neurology

## 2023-05-01 ENCOUNTER — Encounter: Payer: Self-pay | Admitting: Neurology

## 2023-05-01 ENCOUNTER — Ambulatory Visit (INDEPENDENT_AMBULATORY_CARE_PROVIDER_SITE_OTHER): Payer: Managed Care, Other (non HMO) | Admitting: Neurology

## 2023-05-01 VITALS — BP 114/72 | HR 75 | Ht 70.0 in | Wt 168.0 lb

## 2023-05-01 DIAGNOSIS — G5622 Lesion of ulnar nerve, left upper limb: Secondary | ICD-10-CM | POA: Diagnosis not present

## 2023-05-01 DIAGNOSIS — M542 Cervicalgia: Secondary | ICD-10-CM | POA: Diagnosis not present

## 2023-05-01 NOTE — Progress Notes (Signed)
 Follow-up Visit   Date: 05/01/2023    Randall Ward MRN: [id number] DOB: [birth year]    Randall Ward is a 33 y.o. 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  The patient was accompanied to the clinic by self.   IMPRESSION/PLAN: Cervicalgia  - Recommend home exercises; he did a few sessions of PT  - muscle relaxants declined  - OK to use heat/ice/NSAIDs as needed  2.  Left ulnar neuropathy at the elbow, stable.  - Continue to avoid leaning and hyperflexion at the elbow  Return to clinic as needed  --------------------------------------------- History of present illness: For the past 2-3 years, he has noticed that both arms can fall asleep, which can be alleviated with repositioning.  Sometimes during the daytime, he findings that his left medial forearm and hand can become numb.    He also finds that crossing his legs or sitting, such as when driving, his right lower leg can become numb. Stretching can relieve symptoms. MRI lumbar spine did not show any significant disease.    He denies weakness, imbalance, or falls. He endorses chronic neck pain.  MRI cervical spine 06/12/2022 shows mild multilevel cervical spondylosis without impingement.    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   UPDATE 05/01/2023:  He is here for follow-up visit.  He has not noticed any new numbness/tingling in the arms.  He is more mindful of arm positioning.  No new weakness.  He continues to have neck stiffness.  He did neck PT a few times, but co-pay was $50, so he stopped.  He admits that he has not been doing home exercises.   Medications:  Current Outpatient Medications on File Prior to Visit  Medication Sig Dispense Refill   EPINEPHrine (EPIPEN 2-PAK) 0.3 mg/0.3 mL IJ SOAJ injection Inject 0.3 mg into the muscle as needed for anaphylaxis. 2 each 1   Multiple Vitamin (MULTIVITAMIN) tablet  Take 1 tablet by mouth daily.     solifenacin (VESICARE) 10 MG tablet Take 1 tablet (10 mg total) by mouth daily.     No current facility-administered medications on file prior to visit.    Allergies:  Allergies  Allergen Reactions   Ditropan [Oxybutynin]     Dry mouth    Vital Signs:  BP 114/72   Pulse 75   Ht 5\' 10"  (1.778 m)   Wt 168 lb (76.2 kg)   SpO2 98%   BMI 24.11 kg/m   Neurological Exam: MENTAL STATUS including orientation to time, place, person, recent and remote memory, attention span and concentration, language, and fund of knowledge is normal.  Speech is not dysarthric.  CRANIAL NERVES:  Pupils equal round and reactive to light.  Normal conjugate, extra-ocular eye movements in all directions of gaze.  No ptosis.  Face is symmetric.   MOTOR:  Motor strength is 5/5 in all extremities.  No atrophy, fasciculations or abnormal movements.  No pronator drift.  Tone is normal.    MSRs:  Reflexes are 2+/4 throughout.  SENSORY:  Intact to vibration throughout.  COORDINATION/GAIT:   Gait narrow based and stable.   Data: MRI cervical spine 06/12/2022: 1. Mild multilevel cervical spondylosis as detailed above. No significant canal or foraminal stenosis or overt neural impingement. 2. Discogenic reactive endplate change with mild marrow edema about the C3-4, C5-6, and C6-7 interspaces. Finding could contribute to underlying neck pain.   MRI lumbar  spine 07/07/2022: 1. No evidence of fracture or bone lesion. 2. No significant disc disease. 3. Mild bilateral facet joint arthropathy at L4-L5 and L5-S1.  NCS/EMG of the left arm 08/04/2023: Left ulnar neuropathy with slowing across the elbow, demyelinating, moderate. Left median neuropathy at or distal to the wrist, consistent with a clinical diagnosis of carpal tunnel syndrome.  Overall, these findings are very mild in degree electrically. There is no evidence of a lumbosacral radiculopathy or large fiber sensorimotor  polyneuropathy affecting the right lower extremity  Thank you for allowing me to participate in patient's care.  If I can answer any additional questions, I would be pleased to do so.    Sincerely,    Charla Criscione K. Lydia Sams, DO
